# Patient Record
Sex: Male | Born: 1965
Health system: Southern US, Community
[De-identification: ages and names within clinical notes are randomized; demographics above are authoritative.]

## PROBLEM LIST (undated history)

## (undated) DIAGNOSIS — R011 Cardiac murmur, unspecified: Secondary | ICD-10-CM

## (undated) DIAGNOSIS — F17200 Nicotine dependence, unspecified, uncomplicated: Secondary | ICD-10-CM

## (undated) DIAGNOSIS — T7840XA Allergy, unspecified, initial encounter: Secondary | ICD-10-CM

## (undated) DIAGNOSIS — Z8679 Personal history of other diseases of the circulatory system: Secondary | ICD-10-CM

## (undated) DIAGNOSIS — I10 Essential (primary) hypertension: Secondary | ICD-10-CM

## (undated) DIAGNOSIS — F909 Attention-deficit hyperactivity disorder, unspecified type: Secondary | ICD-10-CM

## (undated) HISTORY — DX: Essential (primary) hypertension: I10

## (undated) HISTORY — PX: COLONOSCOPY: SHX174

## (undated) HISTORY — DX: Cardiac murmur, unspecified: R01.1

## (undated) HISTORY — DX: Nicotine dependence, unspecified, uncomplicated: F17.200

## (undated) HISTORY — DX: Allergy, unspecified, initial encounter: T78.40XA

## (undated) HISTORY — PX: TONSILLECTOMY: SUR1361

## (undated) HISTORY — DX: Personal history of other diseases of the circulatory system: Z86.79

## (undated) HISTORY — PX: KNEE ARTHROSCOPY: SUR90

---

## 1997-08-03 ENCOUNTER — Emergency Department (HOSPITAL_COMMUNITY): Admission: EM | Admit: 1997-08-03 | Discharge: 1997-08-04 | Payer: Self-pay | Admitting: Internal Medicine

## 2000-01-05 ENCOUNTER — Ambulatory Visit (HOSPITAL_BASED_OUTPATIENT_CLINIC_OR_DEPARTMENT_OTHER): Admission: RE | Admit: 2000-01-05 | Discharge: 2000-01-05 | Payer: Self-pay | Admitting: Orthopaedic Surgery

## 2005-05-31 ENCOUNTER — Emergency Department (HOSPITAL_COMMUNITY): Admission: EM | Admit: 2005-05-31 | Discharge: 2005-06-01 | Payer: Self-pay | Admitting: Emergency Medicine

## 2005-06-07 ENCOUNTER — Ambulatory Visit: Payer: Self-pay | Admitting: Family Medicine

## 2005-12-01 ENCOUNTER — Ambulatory Visit: Payer: Self-pay | Admitting: Family Medicine

## 2005-12-07 ENCOUNTER — Ambulatory Visit: Payer: Self-pay | Admitting: Family Medicine

## 2006-01-02 HISTORY — PX: VASECTOMY: SHX75

## 2006-08-29 ENCOUNTER — Emergency Department (HOSPITAL_COMMUNITY): Admission: EM | Admit: 2006-08-29 | Discharge: 2006-08-30 | Payer: Self-pay | Admitting: Emergency Medicine

## 2007-01-08 ENCOUNTER — Ambulatory Visit: Payer: Self-pay | Admitting: Family Medicine

## 2007-02-22 ENCOUNTER — Ambulatory Visit: Payer: Self-pay | Admitting: Family Medicine

## 2007-03-17 ENCOUNTER — Emergency Department (HOSPITAL_COMMUNITY): Admission: EM | Admit: 2007-03-17 | Discharge: 2007-03-17 | Payer: Self-pay | Admitting: Family Medicine

## 2007-10-05 ENCOUNTER — Emergency Department (HOSPITAL_COMMUNITY): Admission: EM | Admit: 2007-10-05 | Discharge: 2007-10-06 | Payer: Self-pay | Admitting: Emergency Medicine

## 2007-10-15 ENCOUNTER — Ambulatory Visit: Payer: Self-pay | Admitting: Family Medicine

## 2007-10-17 ENCOUNTER — Ambulatory Visit: Payer: Self-pay | Admitting: Family Medicine

## 2007-11-03 ENCOUNTER — Emergency Department (HOSPITAL_COMMUNITY): Admission: EM | Admit: 2007-11-03 | Discharge: 2007-11-03 | Payer: Self-pay | Admitting: Emergency Medicine

## 2008-01-21 ENCOUNTER — Emergency Department (HOSPITAL_COMMUNITY): Admission: EM | Admit: 2008-01-21 | Discharge: 2008-01-21 | Payer: Self-pay | Admitting: Family Medicine

## 2008-02-06 ENCOUNTER — Ambulatory Visit: Payer: Self-pay | Admitting: Family Medicine

## 2008-07-08 ENCOUNTER — Emergency Department (HOSPITAL_COMMUNITY): Admission: EM | Admit: 2008-07-08 | Discharge: 2008-07-08 | Payer: Self-pay | Admitting: Family Medicine

## 2008-09-10 ENCOUNTER — Ambulatory Visit: Payer: Self-pay | Admitting: Family Medicine

## 2009-01-04 ENCOUNTER — Ambulatory Visit: Payer: Self-pay | Admitting: Family Medicine

## 2009-09-13 ENCOUNTER — Ambulatory Visit: Payer: Self-pay | Admitting: Family Medicine

## 2010-01-11 ENCOUNTER — Ambulatory Visit
Admission: RE | Admit: 2010-01-11 | Discharge: 2010-01-11 | Payer: Self-pay | Source: Home / Self Care | Attending: Family Medicine | Admitting: Family Medicine

## 2010-02-17 ENCOUNTER — Ambulatory Visit: Payer: Self-pay | Admitting: Family Medicine

## 2010-05-04 ENCOUNTER — Institutional Professional Consult (permissible substitution) (INDEPENDENT_AMBULATORY_CARE_PROVIDER_SITE_OTHER): Payer: 59 | Admitting: Medical

## 2010-05-04 DIAGNOSIS — F988 Other specified behavioral and emotional disorders with onset usually occurring in childhood and adolescence: Secondary | ICD-10-CM

## 2010-05-20 NOTE — Op Note (Signed)
[place]. [facility]  Patient:    Joshua Henry, Joshua Henry                   MRN: [id number] Proc. Date: 01/05/00 Attending:  Claude Manges. Cleophas Dunker, M.D.                           Operative Report  PREOPERATIVE DIAGNOSIS:  Tear medial meniscus with locked knee, left knee.  POSTOPERATIVE DIAGNOSIS:  Plica.  OPERATION PERFORMED:  Diagnostic arthroscopy left knee with plickectomy.  SURGEON:  Claude Manges. Cleophas Dunker, M.D.  ASSISTANT:  Arnoldo Morale, P.A.  ANESTHESIA:  IV sedation and local 1% Xylocaine with epinephrine.  COMPLICATIONS:  None.  INDICATIONS FOR PROCEDURE:  The patient is a 45 year old male who injured his left knee while dancing at a Christmas party probably three weeks ago.  He was down on both of his knees in a crouched flexed position when he experienced the onset of left knee pain medially.  He didn ot appreciate any significant popping or clicking.  The pain is moderate and sharp.  It does wake him at night and he has been unable to fully extend his knee.  X-rays were negative, both medial and lateral joint spaces were symmetrical and there was no evidence of any loose material.  It is suspected that he has a tear of the medial meniscus, possibly even bucket handle in nature with a locked knee and is now to have arthroscopic evaluation.  DESCRIPTION OF PROCEDURE:  With the patient comfortable on the operating table under IV sedation, the left lower extremity was placed in thigh holder.  The leg was then prepped with DuraPrep from the thigh holder to the ankle. Sterile draping was performed.  Diagnostic arthroscopy was performed using a medial and lateral parapatellar tendon stab wound.  Irrigation was performed continuously with a cannula in the superior pouch medially.  There was negative effusion.  Diagnostic arthroscopy revealed no evidence of chondromalacia patella.  I did not see any loose material in the superior pouch or either gutter but  there was thickened tissue consistent with a plica in the medial gutter extending from the superior pouch to the medial meniscus.  The medial compartment was clear of meniscal pathology.  I carefully probed the meniscus throughout its entire length and could not find any evidence of a tear and surely there was no evidence of a bucket handle injury.  I did not see any chondromalacia of the femoral condyle or corresponding tibial plateau. Nor did I palpate or visualize any loose bodies.  The ACL was carefully evaluated.  The sheath was intact.  There was essentially negative anterior drawer sign.  The lateral compartment was also carefully inspected without evidence of meniscal pathology or chondromalacia or loose material.  The thickened medial compartment and medial gutter plica and tissue were resected with the intra-articular shaver.  At that point I could completely extend the knee.  I again carefully evaluated the entire joint without evidence of loose material.  The three stab wounds were left open, infiltrated with 0.25% Marcaine with epinephrine and sterile bulky dressing was applied followed by an Ace bandage and an ice pack.  The patient tolerated the procedure without complications.  PLAN:  Percocet for pain.  Office one week. DD:  01/05/00 TD:  01/05/00 Job: [id number]

## 2010-06-03 ENCOUNTER — Telehealth: Payer: Self-pay | Admitting: Family Medicine

## 2010-06-06 ENCOUNTER — Telehealth: Payer: Self-pay | Admitting: Family Medicine

## 2010-06-06 ENCOUNTER — Other Ambulatory Visit: Payer: Self-pay | Admitting: Medical

## 2010-06-06 MED ORDER — LISDEXAMFETAMINE DIMESYLATE 30 MG PO CAPS: 30.0000 mg | ORAL_CAPSULE | ORAL | Status: DC

## 2010-06-06 NOTE — Telephone Encounter (Signed)
Script ready.

## 2010-06-06 NOTE — Telephone Encounter (Signed)
Message copied by Dorthula Perfect on Mon Jun 06, 2010  4:57 PM ------      Message from: Aleen Campi, DAVID S      Created: Mon Jun 06, 2010  1:56 PM       I received msg about the Vyvanse not working.  At this point, if not working, we should increase the dose and f/u in 53mo.  We started him on a low dose, thus, I'd recommend going to the next dose of 30mg .  I'd rather do this first than switch to something else.  If agreeable, I'll print script, if not, can return to discuss other options.

## 2010-06-06 NOTE — Telephone Encounter (Signed)
pls pull chart 

## 2010-06-07 ENCOUNTER — Other Ambulatory Visit: Payer: Self-pay | Admitting: Family Medicine

## 2010-06-07 ENCOUNTER — Telehealth: Payer: Self-pay | Admitting: *Deleted

## 2010-06-07 MED ORDER — LISDEXAMFETAMINE DIMESYLATE 30 MG PO CAPS: 30.0000 mg | ORAL_CAPSULE | ORAL | Status: DC

## 2010-06-07 NOTE — Telephone Encounter (Signed)
Given to [place]

## 2010-06-07 NOTE — Telephone Encounter (Addendum)
Message copied by Dorthula Perfect on Tue Jun 07, 2010  9:08 AM ------      Message from: Aleen Campi, DAVID S      Created: Mon Jun 06, 2010  1:56 PM       I received msg about the Vyvanse not working.  At this point, if not working, we should increase the dose and f/u in 76mo.  We started him on a low dose, thus, I'd recommend going to the next dose of 30mg .  I'd rather do this first than switch to something else.  If agreeable, I'll print script, if not, can return to discuss other options.    Pt called and agreed to Vyvanse 30mg .  Would like 3 months supply.  Pt notified of prescription of Vyvanse 30 mg for a 3 month supply.  Prescription at front desk for pt to pickup and pt is aware.  CM,LPN

## 2010-07-07 ENCOUNTER — Ambulatory Visit (HOSPITAL_COMMUNITY)
Admission: RE | Admit: 2010-07-07 | Discharge: 2010-07-07 | Disposition: A | Discharge status: Home / Self Care | Payer: 59 | Source: Ambulatory Visit | Attending: Orthopaedic Surgery | Admitting: Orthopaedic Surgery

## 2010-07-07 ENCOUNTER — Encounter (HOSPITAL_COMMUNITY)
Admission: RE | Admit: 2010-07-07 | Discharge: 2010-07-07 | Disposition: A | Discharge status: Home / Self Care | Payer: 59 | Source: Ambulatory Visit | Attending: Orthopaedic Surgery | Admitting: Orthopaedic Surgery

## 2010-07-07 ENCOUNTER — Other Ambulatory Visit (HOSPITAL_COMMUNITY): Payer: Self-pay | Admitting: Orthopaedic Surgery

## 2010-07-07 DIAGNOSIS — Z01810 Encounter for preprocedural cardiovascular examination: Secondary | ICD-10-CM | POA: Insufficient documentation

## 2010-07-07 DIAGNOSIS — Z01811 Encounter for preprocedural respiratory examination: Secondary | ICD-10-CM

## 2010-07-07 DIAGNOSIS — Z01818 Encounter for other preprocedural examination: Secondary | ICD-10-CM | POA: Insufficient documentation

## 2010-07-07 DIAGNOSIS — Z01812 Encounter for preprocedural laboratory examination: Secondary | ICD-10-CM | POA: Insufficient documentation

## 2010-07-07 LAB — URINALYSIS, ROUTINE W REFLEX MICROSCOPIC
Hgb urine dipstick: NEGATIVE
Ketones, ur: NEGATIVE mg/dL
Nitrite: NEGATIVE
Protein, ur: NEGATIVE mg/dL
Specific Gravity, Urine: 1.025 (ref 1.005–1.030)
Urobilinogen, UA: 1 mg/dL (ref 0.0–1.0)
pH: 6.5 (ref 5.0–8.0)

## 2010-07-07 LAB — SURGICAL PCR SCREEN
MRSA, PCR: NEGATIVE
Staphylococcus aureus: NEGATIVE

## 2010-07-07 LAB — CBC
HCT: 46.5 % (ref 39.0–52.0)
MCHC: 36.8 g/dL — ABNORMAL HIGH (ref 30.0–36.0)
MCV: 90.8 fL (ref 78.0–100.0)
Platelets: 198 10*3/uL (ref 150–400)

## 2010-07-07 LAB — COMPREHENSIVE METABOLIC PANEL
ALT: 18 U/L (ref 0–53)
AST: 23 U/L (ref 0–37)
Albumin: 4 g/dL (ref 3.5–5.2)
Alkaline Phosphatase: 93 U/L (ref 39–117)
CO2: 31 mEq/L (ref 19–32)
GFR calc Af Amer: >60 mL/min (ref >60)
Potassium: 5.3 mEq/L — ABNORMAL HIGH (ref 3.5–5.1)
Total Bilirubin: 0.2 mg/dL — ABNORMAL LOW (ref 0.3–1.2)

## 2010-07-07 LAB — PROTIME-INR: Prothrombin Time: 11.8 seconds (ref 11.6–15.2)

## 2010-07-07 LAB — DIFFERENTIAL
Monocytes Absolute: 0.7 10*3/uL (ref 0.1–1.0)
Neutro Abs: 5 10*3/uL (ref 1.7–7.7)

## 2010-07-12 ENCOUNTER — Ambulatory Visit (HOSPITAL_COMMUNITY)
Admission: RE | Admit: 2010-07-12 | Discharge: 2010-07-12 | Disposition: A | Discharge status: Home / Self Care | Payer: 59 | Source: Ambulatory Visit | Attending: Orthopaedic Surgery | Admitting: Orthopaedic Surgery

## 2010-07-12 DIAGNOSIS — Z01810 Encounter for preprocedural cardiovascular examination: Secondary | ICD-10-CM | POA: Insufficient documentation

## 2010-07-12 DIAGNOSIS — M659 Unspecified synovitis and tenosynovitis, unspecified site: Secondary | ICD-10-CM | POA: Insufficient documentation

## 2010-07-12 DIAGNOSIS — IMO0002 Reserved for concepts with insufficient information to code with codable children: Secondary | ICD-10-CM | POA: Insufficient documentation

## 2010-07-12 DIAGNOSIS — X500XXA Overexertion from strenuous movement or load, initial encounter: Secondary | ICD-10-CM | POA: Insufficient documentation

## 2010-07-12 DIAGNOSIS — Z01812 Encounter for preprocedural laboratory examination: Secondary | ICD-10-CM | POA: Insufficient documentation

## 2010-07-12 DIAGNOSIS — F17200 Nicotine dependence, unspecified, uncomplicated: Secondary | ICD-10-CM | POA: Insufficient documentation

## 2010-07-13 NOTE — Op Note (Signed)
NAME:  Joshua Henry, Joshua Henry          ACCOUNT NO.:  [id number]  MEDICAL RECORD NO.:  [id number]  LOCATION:  SDS                          FACILITY:  [facility]  PHYSICIAN:  Claude Manges. Karman Biswell, M.D.DATE OF BIRTH:  12/21/1965  DATE OF PROCEDURE:  07/12/2010 DATE OF DISCHARGE:                              OPERATIVE REPORT   PREOPERATIVE DIAGNOSIS:  Displaced bucket-handle tear of medial meniscus, left knee.  POSTOPERATIVE DIAGNOSIS:  Displaced bucket-handle tear of medial meniscus, left knee.  PROCEDURE:  Diagnostic arthroscopy, left knee with partial medial meniscectomy.  SURGEON:  Claude Manges. Cleophas Dunker, MD  ASSISTANT:  Oris Drone. Petrarca, PA-C  ANESTHESIA:  General.  COMPLICATIONS:  None.  HISTORY:  A 45 year old gentleman was stooping about a week ago when he had immediate onset of pain in his left knee with inability to fully extend the knee.  He was seen in the office last week with what appeared to be a displaced bucket-handle tear of the medial meniscus.  Because of his locked knee and the fact that it has not resolved and he is still uncomfortable, he is now to have an arthroscopic evaluation.  PROCEDURE:  Joshua Henry was met in the holding area where I identified his left knee as appropriate operative site.  He was then transported to room #1 and placed under general anesthesia without difficulty.  The left lower extremity was placed in a thigh holder.  Leg was then prepped with DuraPrep from the thigh holder to the ankle.  Sterile draping was performed.  Diagnostic arthroscopy was performed using a medial and lateral parapatellar tendon puncture site.  The portals appeared to be a little low, so I just lengthen them slightly and then I could have better visualization.  Lateral compartment was intact as the meniscus.  There was no tearing in the lateral meniscus.  There were no loose bodies. The ACL and PCL appeared to be intact.  There appeared to be a little bit of  fraying of the ACL, but otherwise, appeared to be intact with negative anterior-drawer sign.  There was diffuse synovitis in the superior pouch and about a 30 mL effusion.  In the medial compartment, there was an obvious displaced bucket-handle tear of the medial meniscus involving at least 60%.  It was still attached anteriorly at the junction of the middle and anterior third and posteriorly at the horn. I established a third arthroscopic portal through the center of the patella tendon so that I could debride the meniscus, and using a combination of intra-articular scissors, the ArthroCare wand, and the shaver, I was able to debride the meniscus completely.  I then carefully probed it.  There was a small horizontal cleavage tear in the very posterior aspect of the meniscus which was debrided with the ArthroCare Wand.  At that point, the knee would fully extend and I carefully probed the meniscus and the remaining meniscus was intact.  Any further synovitis was debrided.  There was some chondromalacia representing grade 2 in the medial compartment, and I thought some area of exposed tibia plateau very anteriorly, but I did not see any loose bodies.  The wounds were then irrigated.  They were closed with 3-0 Vicryl and then 4-0 Ethibond.  A sterile bulky dressing was applied.  PLAN:  Vicodin for pain.  Office in 1 week.     Claude Manges. Cleophas Dunker, M.D.     PWW/MEDQ  D:  07/12/2010  T:  07/13/2010  Job:  [id number]  Electronically Signed by Norlene Campbell M.D. on 07/13/2010 11:42:35 AM

## 2010-09-12 ENCOUNTER — Ambulatory Visit (INDEPENDENT_AMBULATORY_CARE_PROVIDER_SITE_OTHER): Payer: 59 | Admitting: Family Medicine

## 2010-09-12 ENCOUNTER — Encounter: Payer: Self-pay | Admitting: Family Medicine

## 2010-09-12 VITALS — BP 130/86 | HR 67 | Wt 173.0 lb

## 2010-09-12 DIAGNOSIS — F909 Attention-deficit hyperactivity disorder, unspecified type: Secondary | ICD-10-CM

## 2010-09-12 DIAGNOSIS — F1010 Alcohol abuse, uncomplicated: Secondary | ICD-10-CM

## 2010-09-12 MED ORDER — LISDEXAMFETAMINE DIMESYLATE 20 MG PO CAPS: 20.0000 mg | ORAL_CAPSULE | ORAL | Status: DC

## 2010-09-12 NOTE — Patient Instructions (Signed)
Have her Resa call me after she has met with you and we will discuss medication at that point

## 2010-09-12 NOTE — Progress Notes (Signed)
  Subjective:    Patient ID: Joshua Henry, male    DOB: 1965/09/15, 45 y.o.   MRN: [id number]  HPI He is here for recheck. Since switching to Vyvanse he is getting less of a hyper sensation and states that he is sleeping better. It lasts roughly the same time however he is not having any withdrawal symptoms. He is also involved in counseling but mainly at request of his wife. He has had vocal related problems and in the past has been involved in [place]. He is no longer interested in [place]. He does not think he has a drinking problem. He also seems to have very little interest in counseling except at the request of his wife. His wife thinks he might have a mood disorder. Review of Systems     Objective:   Physical Exam Alert and in no distress otherwise not examined      Assessment & Plan:  ADD. Alcohol abuse. Encouraged him to get involved in counseling and have his therapist call me for her input into possible medication use. I will also renew his Vyvanse or September, October and November.

## 2010-09-19 ENCOUNTER — Other Ambulatory Visit: Payer: Self-pay | Admitting: Family Medicine

## 2010-09-19 MED ORDER — LISDEXAMFETAMINE DIMESYLATE 30 MG PO CAPS: 30.0000 mg | ORAL_CAPSULE | ORAL | Status: DC

## 2010-09-19 NOTE — Telephone Encounter (Signed)
All of our records indicate he is getting 20 mg. Inform him of this and if there is any questions check with the pharmacy

## 2010-09-19 NOTE — Telephone Encounter (Signed)
In may pt was on 20 mg after that he has been on 30 mg until now

## 2010-09-19 NOTE — Telephone Encounter (Signed)
Addended by: Ronnald Nian on: 09/19/2010 04:25 PM   Modules accepted: Orders

## 2010-09-19 NOTE — Telephone Encounter (Signed)
Called pt to inform him he was right to bring in old rx and will write new ones for 30mg 

## 2010-09-19 NOTE — Telephone Encounter (Signed)
He is indeed getting by Faylene Million 30 mg. He was not documented in the right part of the chart. I will write new prescriptions for September October and November and he will turn the old ones in for destruction.

## 2010-10-08 ENCOUNTER — Inpatient Hospital Stay (INDEPENDENT_AMBULATORY_CARE_PROVIDER_SITE_OTHER)
Admission: RE | Admit: 2010-10-08 | Discharge: 2010-10-08 | Disposition: A | Discharge status: Home / Self Care | Payer: 59 | Source: Ambulatory Visit | Attending: Family Medicine | Admitting: Family Medicine

## 2010-10-08 DIAGNOSIS — B029 Zoster without complications: Secondary | ICD-10-CM

## 2010-10-08 DIAGNOSIS — N509 Disorder of male genital organs, unspecified: Secondary | ICD-10-CM

## 2010-10-08 DIAGNOSIS — M549 Dorsalgia, unspecified: Secondary | ICD-10-CM

## 2010-10-08 LAB — POCT URINALYSIS DIP (DEVICE)
Bilirubin Urine: NEGATIVE
Glucose, UA: NEGATIVE mg/dL
Leukocytes, UA: NEGATIVE
Specific Gravity, Urine: 1.02 (ref 1.005–1.030)
Urobilinogen, UA: 1 mg/dL (ref 0.0–1.0)

## 2010-10-10 ENCOUNTER — Other Ambulatory Visit: Payer: Self-pay | Admitting: Family Medicine

## 2010-10-10 LAB — GC/CHLAMYDIA PROBE AMP, GENITAL: GC Probe Amp, Genital: NEGATIVE

## 2010-10-10 MED ORDER — HYDROCODONE-ACETAMINOPHEN 7.5-750 MG PO TABS: 1.0000 | ORAL_TABLET | Freq: Four times a day (QID) | ORAL | Status: DC | PRN

## 2010-10-10 NOTE — Telephone Encounter (Signed)
Called pt to let him know med was called and he needs appt

## 2010-10-10 NOTE — Telephone Encounter (Signed)
Call in Vicodin ES  #20 and explained that this is a higher strength pill. Have him set up an appointment to see me in the next day or so

## 2010-10-12 ENCOUNTER — Encounter: Payer: Self-pay | Admitting: Family Medicine

## 2010-10-12 ENCOUNTER — Ambulatory Visit (INDEPENDENT_AMBULATORY_CARE_PROVIDER_SITE_OTHER): Payer: 59 | Admitting: Family Medicine

## 2010-10-12 VITALS — BP 112/70 | HR 73 | Wt 170.0 lb

## 2010-10-12 DIAGNOSIS — H6120 Impacted cerumen, unspecified ear: Secondary | ICD-10-CM

## 2010-10-12 DIAGNOSIS — H9319 Tinnitus, unspecified ear: Secondary | ICD-10-CM

## 2010-10-12 DIAGNOSIS — K5900 Constipation, unspecified: Secondary | ICD-10-CM

## 2010-10-12 DIAGNOSIS — B029 Zoster without complications: Secondary | ICD-10-CM

## 2010-10-12 MED ORDER — HYDROCODONE-ACETAMINOPHEN 10-660 MG PO TABS: 1.0000 | ORAL_TABLET | Freq: Four times a day (QID) | ORAL | Status: DC | PRN

## 2010-10-12 NOTE — Progress Notes (Signed)
  Subjective:    Patient ID: Joshua Henry, male    DOB: [birth year], 45 y.o.   MRN: [id number]  HPI He is here for followup on recent diagnosis of shingles. He is still having a significant amount of pain it radiates down his left leg. He also has had some difficulty with constipation related to this and his pain meds. The pain meds are not holding him long enough. He also complains of a ringing in sensation in his right ear that has been intermittent over the last several months. No earache, sore throat, fever or chills   Review of Systems     Objective:   Physical Exam alert and in no distress. Tympanic membranes both show some scarring especially on the right, canals are normal. Throat is clear. Tonsils are normal. Neck is supple without adenopathy or thyromegaly. Cardiac exam shows a regular sinus rhythm without murmurs or gallops. Lungs are clear to auscultation. Slightly pigmented area is noted in the L2 on the left near the mid spine area. No other skin lesions are noted.        Assessment & Plan:   1. Shingles outbreak   2. Cerumen impaction   3. Tinnitus   4. Constipation    the tinnitus to go away after the cerumen was removed. We will not pursue the ear issue unless it continues. I will also give him Vicodin 10/660. Discussed pain management with him now and long term. Followup here as needed.

## 2010-10-12 NOTE — Patient Instructions (Addendum)
Use pain medicine as needed every 4 hours. Stop the doxycycline Use a laxative of choice to help with the constipation. He can also use Metamucil to help keep more regular

## 2010-10-14 LAB — SAMPLE TO BLOOD BANK

## 2010-10-14 LAB — I-STAT 8, (EC8 V) (CONVERTED LAB)
Bicarbonate: 25.4 — ABNORMAL HIGH
Chloride: 104
HCT: 47
Hemoglobin: 16
Operator id: 277751
TCO2: 27
pCO2, Ven: 44.6 — ABNORMAL LOW
pH, Ven: 7.363 — ABNORMAL HIGH

## 2010-10-14 LAB — URINALYSIS, ROUTINE W REFLEX MICROSCOPIC
Bilirubin Urine: NEGATIVE
Glucose, UA: NEGATIVE
Hgb urine dipstick: NEGATIVE
Ketones, ur: NEGATIVE
Nitrite: NEGATIVE
Specific Gravity, Urine: 1.011
pH: 6.5

## 2010-10-14 LAB — POCT I-STAT CREATININE: Creatinine, Ser: 0.9

## 2010-12-14 ENCOUNTER — Telehealth: Payer: Self-pay | Admitting: Internal Medicine

## 2010-12-14 ENCOUNTER — Telehealth: Payer: Self-pay | Admitting: Family Medicine

## 2010-12-14 MED ORDER — LISDEXAMFETAMINE DIMESYLATE 20 MG PO CAPS: 20.0000 mg | ORAL_CAPSULE | ORAL | Status: DC

## 2010-12-14 NOTE — Telephone Encounter (Signed)
TSD  

## 2010-12-14 NOTE — Telephone Encounter (Signed)
Vyvanse prescription written

## 2011-03-13 ENCOUNTER — Encounter: Payer: Self-pay | Admitting: Family Medicine

## 2011-03-13 ENCOUNTER — Ambulatory Visit (INDEPENDENT_AMBULATORY_CARE_PROVIDER_SITE_OTHER): Payer: 59 | Admitting: Family Medicine

## 2011-03-13 ENCOUNTER — Encounter: Payer: 59 | Admitting: Family Medicine

## 2011-03-13 DIAGNOSIS — H6120 Impacted cerumen, unspecified ear: Secondary | ICD-10-CM

## 2011-03-13 DIAGNOSIS — H918X9 Other specified hearing loss, unspecified ear: Secondary | ICD-10-CM

## 2011-03-13 DIAGNOSIS — F988 Other specified behavioral and emotional disorders with onset usually occurring in childhood and adolescence: Secondary | ICD-10-CM | POA: Insufficient documentation

## 2011-03-13 MED ORDER — LISDEXAMFETAMINE DIMESYLATE 30 MG PO CAPS: 30.0000 mg | ORAL_CAPSULE | ORAL | Status: DC

## 2011-03-13 NOTE — Progress Notes (Signed)
  Subjective:    Patient ID: Joshua Henry, male    DOB: May 09, 1965, 46 y.o.   MRN: [id number]  HPI He is here for medication refill. He is now taking 5 bands 30 mg. There was apparently a mixup with communication of the correct dosing and 30 mg does work well. He would like a 90 day supply of this. He continues to go school but has become disenchanted with his present course. He also been having difficulty with hearing from the right ear. He has a previous history of cerumen impaction.   Review of Systems     Objective:   Physical Exam Cerumen was removed from the right canal. Both TMs do show some scarring.       Assessment & Plan:   1. ADD (attention deficit disorder)   2. Hearing loss secondary to cerumen impaction    Vyvance written for 90 days.

## 2011-03-14 ENCOUNTER — Telehealth: Payer: Self-pay | Admitting: Family Medicine

## 2011-03-20 ENCOUNTER — Telehealth: Payer: Self-pay | Admitting: Family Medicine

## 2011-03-20 NOTE — Telephone Encounter (Signed)
P.A. Arlyce Harman APPROVED TIL 03/16/2037

## 2011-03-20 NOTE — Telephone Encounter (Signed)
DONE

## 2011-06-13 ENCOUNTER — Institutional Professional Consult (permissible substitution): Payer: 59 | Admitting: Family Medicine

## 2011-06-16 ENCOUNTER — Telehealth: Payer: Self-pay | Admitting: Family Medicine

## 2011-06-16 MED ORDER — LISDEXAMFETAMINE DIMESYLATE 30 MG PO CAPS: 30.0000 mg | ORAL_CAPSULE | ORAL | Status: DC

## 2011-06-16 NOTE — Telephone Encounter (Signed)
Vyvanse renewed 

## 2011-06-19 ENCOUNTER — Encounter: Payer: 59 | Admitting: Family Medicine

## 2011-09-18 ENCOUNTER — Other Ambulatory Visit: Payer: Self-pay | Admitting: Internal Medicine

## 2011-09-18 MED ORDER — LISDEXAMFETAMINE DIMESYLATE 30 MG PO CAPS: 30.0000 mg | ORAL_CAPSULE | ORAL | Status: DC

## 2011-09-18 NOTE — Telephone Encounter (Signed)
Called pt cell # to inform him he should have rx covering him the month of sept left message on phone

## 2011-09-18 NOTE — Telephone Encounter (Signed)
PT CALLED ME BACK SAID HE ONLY GOT A 90 DAY SUPPLY IN June SO THAT WOULD LAST Tammi Sou

## 2011-09-18 NOTE — Telephone Encounter (Signed)
Let him know that he should have a prescription covering him for September

## 2011-09-18 NOTE — Telephone Encounter (Signed)
90 day Vyvanse prescription written

## 2011-10-23 ENCOUNTER — Encounter: Payer: 59 | Admitting: Family Medicine

## 2011-11-07 ENCOUNTER — Encounter: Payer: 59 | Admitting: Family Medicine

## 2011-11-24 ENCOUNTER — Encounter: Payer: Self-pay | Admitting: Medical

## 2011-11-24 ENCOUNTER — Ambulatory Visit (INDEPENDENT_AMBULATORY_CARE_PROVIDER_SITE_OTHER): Payer: 59 | Admitting: Medical

## 2011-11-24 ENCOUNTER — Ambulatory Visit
Admission: RE | Admit: 2011-11-24 | Discharge: 2011-11-24 | Disposition: A | Discharge status: Home / Self Care | Payer: 59 | Source: Ambulatory Visit | Attending: Medical | Admitting: Medical

## 2011-11-24 VITALS — BP 120/80 | HR 82 | Temp 98.1°F | Wt 186.0 lb

## 2011-11-24 DIAGNOSIS — R059 Cough, unspecified: Secondary | ICD-10-CM

## 2011-11-24 DIAGNOSIS — R062 Wheezing: Secondary | ICD-10-CM

## 2011-11-24 DIAGNOSIS — R05 Cough: Secondary | ICD-10-CM

## 2011-11-24 DIAGNOSIS — M549 Dorsalgia, unspecified: Secondary | ICD-10-CM

## 2011-11-24 DIAGNOSIS — IMO0002 Reserved for concepts with insufficient information to code with codable children: Secondary | ICD-10-CM

## 2011-11-24 DIAGNOSIS — M6740 Ganglion, unspecified site: Secondary | ICD-10-CM

## 2011-11-24 LAB — CBC WITH DIFFERENTIAL/PLATELET
Basophils Relative: 0 % (ref 0–1)
Eosinophils Absolute: 0.1 10*3/uL (ref 0.0–0.7)
HCT: 48.2 % (ref 39.0–52.0)
Hemoglobin: 17.2 g/dL — ABNORMAL HIGH (ref 13.0–17.0)
Lymphs Abs: 2.6 10*3/uL (ref 0.7–4.0)
MCH: 32.5 pg (ref 26.0–34.0)
MCHC: 35.7 g/dL (ref 30.0–36.0)
MCV: 90.9 fL (ref 78.0–100.0)
Monocytes Absolute: 0.6 10*3/uL (ref 0.1–1.0)
Monocytes Relative: 5 % (ref 3–12)
Neutrophils Relative %: 70 % (ref 43–77)
RBC: 5.3 MIL/uL (ref 4.22–5.81)

## 2011-11-24 MED ORDER — VALACYCLOVIR HCL 1 G PO TABS: 1000.0000 mg | ORAL_TABLET | Freq: Three times a day (TID) | ORAL | Status: DC

## 2011-11-24 NOTE — Progress Notes (Signed)
Subjective: Here for multiple concerns.  Wonders if he has shingles.  He reports hx/o mild shingles prior on the right back, and this past week has been having some discomfort similar to the way it was with prior shingles.  No rash, but odd sensitivity.  He notes intermittent cough and wheezing for the last few months.  He is a long term smoker, a little over 1 ppd.  Wants CXR.  At times feels like saliva goes down wrong way.  No hemoptysis, no weight loss, night sweats, chills.  No allergy symptoms, doesn't feel sick.  No GERD symptoms.    Has cyst on hand he is concerns about.  Sometimes painful.  Wants it cut out - right palm  Past Medical History  Diagnosis Date  . Allergy     RHINITIS  . Smoker   . Heart murmur     TRIVIAL TRICUSPPID INSUFF.  . H/O: rheumatic fever    ROS as in HPI    Objective:   Physical Exam  Filed Vitals:   11/24/11 1339  BP: 120/80  Pulse: 82  Temp: 98.1 F (36.7 C)    General appearance: alert, no distress, WD/WN HEENT: normocephalic, sclerae anicteric, TMs pearly, nares patent, no discharge or erythema, pharynx normal Oral cavity: MMM, no lesions Neck: supple, no lymphadenopathy, no thyromegaly, no masses Heart: RRR, normal S1, S2, no murmurs Lungs: +rhonchi lower fields, dullness to percussion bilat lower fields, otherwise clear, no wheezes or rales Skin: no obvious rash.  Right palm with 1cm thickened yellowish dense tissue, round lesion, suggestive of cystic lesion, but nonmobile Back: nontender  Assessment and Plan :    Encounter Diagnoses  Name Primary?  . Wheezing Yes  . Cough   . Cyst of hand   . Back pain    Wheezing, cough - CXR, labs today.  Advised he stop smoking.  Cyst of hand - will discuss with Dr. Susann Givens, but will likely refer to hand surgeon  Back pain - possible shingles.  Script or Valtrex in the event things fulminate. Call/return prn.  discussed precautions.  Follow-up pending studies

## 2011-11-25 LAB — BASIC METABOLIC PANEL
BUN: 9 mg/dL (ref 6–23)
CO2: 26 mEq/L (ref 19–32)
Calcium: 9.2 mg/dL (ref 8.4–10.5)
Glucose, Bld: 100 mg/dL — ABNORMAL HIGH (ref 70–99)
Potassium: 4.4 mEq/L (ref 3.5–5.3)
Sodium: 138 mEq/L (ref 135–145)

## 2011-11-27 ENCOUNTER — Telehealth: Payer: Self-pay | Admitting: Medical

## 2011-11-28 NOTE — Telephone Encounter (Signed)
[facility]ANE [facility] CHANDRA HANDLED

## 2011-12-05 ENCOUNTER — Telehealth: Payer: Self-pay | Admitting: Family Medicine

## 2011-12-05 NOTE — Telephone Encounter (Signed)
PATIENT IS AWARE OF HIS APPOINTMENT WITH DR. GARY KUZMA ON 12/08/11 @ 1030 AM. [phone] CLS I Fax over office notes and insurance card through [employer]. CLS

## 2011-12-18 ENCOUNTER — Other Ambulatory Visit: Payer: Self-pay | Admitting: Family Medicine

## 2011-12-18 MED ORDER — LISDEXAMFETAMINE DIMESYLATE 30 MG PO CAPS: 30.0000 mg | ORAL_CAPSULE | ORAL | Status: DC

## 2011-12-18 NOTE — Telephone Encounter (Signed)
Pt called and wants refill for Vyvanse 30 mg qd.  If you can get it ready today please call him at [phone]

## 2011-12-18 NOTE — Telephone Encounter (Signed)
Vyvanse renewed 

## 2012-03-22 ENCOUNTER — Telehealth: Payer: Self-pay | Admitting: Family Medicine

## 2012-03-25 ENCOUNTER — Institutional Professional Consult (permissible substitution): Payer: Self-pay | Admitting: Family Medicine

## 2012-03-25 NOTE — Telephone Encounter (Signed)
30 day supply of Vyvanse given

## 2012-05-02 ENCOUNTER — Encounter: Payer: Self-pay | Admitting: Family Medicine

## 2012-05-02 ENCOUNTER — Ambulatory Visit (INDEPENDENT_AMBULATORY_CARE_PROVIDER_SITE_OTHER): Payer: 59 | Admitting: Family Medicine

## 2012-05-02 VITALS — BP 130/90 | HR 86 | Wt 197.0 lb

## 2012-05-02 DIAGNOSIS — M79609 Pain in unspecified limb: Secondary | ICD-10-CM

## 2012-05-02 DIAGNOSIS — F988 Other specified behavioral and emotional disorders with onset usually occurring in childhood and adolescence: Secondary | ICD-10-CM

## 2012-05-02 DIAGNOSIS — M79671 Pain in right foot: Secondary | ICD-10-CM

## 2012-05-02 MED ORDER — METHYLPHENIDATE HCL ER (LA) 10 MG PO CP24: 10.0000 mg | ORAL_CAPSULE | ORAL | Status: DC

## 2012-05-02 NOTE — Patient Instructions (Addendum)
Call me and let me know how the different strengths of rhythm is working. Keep your feet clean and dry.

## 2012-05-02 NOTE — Progress Notes (Signed)
  Subjective:    Patient ID: Joshua Henry, male    DOB: Nov 23, 1965, 47 y.o.   MRN: [id number]  HPI He is here for consult concerning his ADD. He would like to be switched to a less expensive medication. He's paying $75 every 3 months and states that this is too high. In the past he had been on Ritalin and did have some side effects from medication. He also complains of intermittent difficulty with right lateral foot pain mainly in the fourth and fifth toe area. No history of trauma.   Review of Systems     Objective:   Physical Exam Alert and in no distress. Exam of the right foot does show maceration between the fourth and fifth toes. There is no tenderness palpation, erythema or warmth.       Assessment & Plan:  ADD (attention deficit disorder) - Plan: methylphenidate (RITALIN LA) 10 MG 24 hr capsule  Right foot pain does use of Ritalin LA to see if this will help diminish side effects. Also recommend general care for his foot including washing and when he can going barefoot to help with keeping the area dry.

## 2012-05-04 ENCOUNTER — Telehealth: Payer: Self-pay | Admitting: Family Medicine

## 2012-05-09 NOTE — Telephone Encounter (Signed)
LM

## 2012-06-28 ENCOUNTER — Encounter: Payer: 59 | Admitting: Family Medicine

## 2012-07-10 ENCOUNTER — Telehealth: Payer: Self-pay | Admitting: Family Medicine

## 2012-07-10 NOTE — Telephone Encounter (Signed)
PT CALLED AND STATED HE WAS RECENTLY CHANGED FROM VYVANCE TO RITALIN XR DUE TO COST. PT STATES IT IS STILL TOO EXPENSIVE. HE IS REQUESTING THAT HE CHANGE TO ADDERALL TO TRY THAT. PLEASE CALL PT AT [phone].

## 2012-07-11 ENCOUNTER — Telehealth: Payer: Self-pay | Admitting: Family Medicine

## 2012-07-11 MED ORDER — AMPHETAMINE-DEXTROAMPHETAMINE 10 MG PO TABS: 10.0000 mg | ORAL_TABLET | Freq: Two times a day (BID) | ORAL | Status: DC

## 2012-07-11 NOTE — Telephone Encounter (Signed)
FAXED IN

## 2012-07-11 NOTE — Telephone Encounter (Signed)
Tell him to come by and pick up the prescription and let him know that he needs to let me know if it works, how long and if he has any troubles with that.

## 2012-07-11 NOTE — Telephone Encounter (Signed)
He states that his ADD meds 2 expensive and he would like to try Adderall. I will start him on 10 mg. He is to let me know if it works, how long and if he has any trouble.

## 2012-07-11 NOTE — Telephone Encounter (Signed)
Received prior auth notice for adderall. Form was requested and sending back to cheri for completion.

## 2012-07-11 NOTE — Telephone Encounter (Signed)
Pt notified that note is ready and to call him if he has any questions about the medicine

## 2012-07-11 NOTE — Telephone Encounter (Signed)
I THINK THIS COME TO YOU

## 2012-07-16 ENCOUNTER — Telehealth: Payer: Self-pay | Admitting: Family Medicine

## 2012-07-16 NOTE — Telephone Encounter (Signed)
LM

## 2012-08-05 ENCOUNTER — Encounter: Payer: 59 | Admitting: Family Medicine

## 2012-11-26 ENCOUNTER — Ambulatory Visit (INDEPENDENT_AMBULATORY_CARE_PROVIDER_SITE_OTHER): Payer: 59 | Admitting: Family Medicine

## 2012-11-26 ENCOUNTER — Encounter: Payer: Self-pay | Admitting: Family Medicine

## 2012-11-26 VITALS — BP 116/80 | HR 66 | Ht 70.0 in | Wt 201.0 lb

## 2012-11-26 DIAGNOSIS — J301 Allergic rhinitis due to pollen: Secondary | ICD-10-CM

## 2012-11-26 DIAGNOSIS — F988 Other specified behavioral and emotional disorders with onset usually occurring in childhood and adolescence: Secondary | ICD-10-CM

## 2012-11-26 DIAGNOSIS — F17200 Nicotine dependence, unspecified, uncomplicated: Secondary | ICD-10-CM

## 2012-11-26 DIAGNOSIS — Z0000 Encounter for general adult medical examination without abnormal findings: Secondary | ICD-10-CM

## 2012-11-26 LAB — CBC WITH DIFFERENTIAL/PLATELET
Basophils Absolute: 0.1 10*3/uL (ref 0.0–0.1)
Basophils Relative: 1 % (ref 0–1)
Eosinophils Relative: 1 % (ref 0–5)
HCT: 49.5 % (ref 39.0–52.0)
MCHC: 35.8 g/dL (ref 30.0–36.0)
MCV: 92 fL (ref 78.0–100.0)
Monocytes Absolute: 0.5 10*3/uL (ref 0.1–1.0)
Platelets: 154 10*3/uL (ref 150–400)
RDW: 13.8 % (ref 11.5–15.5)

## 2012-11-26 LAB — LIPID PANEL
HDL: 47 mg/dL (ref >39)
LDL Cholesterol: 124 mg/dL — ABNORMAL HIGH (ref 0–99)
Total CHOL/HDL Ratio: 5.1 Ratio
VLDL: 69 mg/dL — ABNORMAL HIGH (ref 0–40)

## 2012-11-26 LAB — HEMOCCULT GUIAC POC 1CARD (OFFICE)

## 2012-11-26 LAB — COMPREHENSIVE METABOLIC PANEL
AST: 25 U/L (ref 0–37)
Alkaline Phosphatase: 90 U/L (ref 39–117)
BUN: 11 mg/dL (ref 6–23)
Creat: 1.01 mg/dL (ref 0.50–1.35)
Total Bilirubin: 0.4 mg/dL (ref 0.3–1.2)

## 2012-11-26 MED ORDER — AMPHETAMINE-DEXTROAMPHETAMINE 10 MG PO TABS: 10.0000 mg | ORAL_TABLET | Freq: Two times a day (BID) | ORAL | Status: DC

## 2012-11-26 NOTE — Progress Notes (Signed)
Subjective:    Patient ID: Joshua Henry, male    DOB: 1965-02-15, 47 y.o.   MRN: [id number]  HPI He is here for a complete examination. He recently quit his job is now looking for a new one. Apparently he had a falling out with his boss. He does have underlying ADD and like a refill on his Adderall. Apparently the 10 mg twice a day has worked well for him. His allergies are under good control. He continues to smoke and at this point is not ready to quit. He has gained a lot of weight recently due to his excessive eating and drinking. He does plan to make some changes in that regard. He has no other concerns or complaints.   Review of Systems  Constitutional: Negative.   HENT: Negative.   Eyes: Negative.   Respiratory: Negative.   Cardiovascular: Negative.   Gastrointestinal: Negative.   Endocrine: Negative.   Genitourinary: Negative.   Allergic/Immunologic: Negative.   Neurological: Negative.   Hematological: Negative.   Psychiatric/Behavioral: Negative.        Objective:   Physical Exam BP 116/80  Pulse 66  Ht 5\' 10"  (1.778 m)  Wt 201 lb (91.173 kg)  BMI 28.84 kg/m2  General Appearance:    Alert, cooperative, no distress, appears stated age  Head:    Normocephalic, without obvious abnormality, atraumatic  Eyes:    PERRL, conjunctiva/corneas clear, EOM's intact, fundi    benign  Ears:    Normal TM's and external ear canals  Nose:   Nares normal, mucosa normal, no drainage or sinus   tenderness  Throat:   Lips, mucosa, and tongue normal; teeth and gums normal  Neck:   Supple, no lymphadenopathy;  thyroid:  no   enlargement/tenderness/nodules; no carotid   bruit or JVD  Back:    Spine nontender, no curvature, ROM normal, no CVA     tenderness  Lungs:     Clear to auscultation bilaterally without wheezes, rales or     ronchi; respirations unlabored  Chest Wall:    No tenderness or deformity   Heart:    Regular rate and rhythm, S1 and S2 normal, no murmur, rub   or  gallop  Breast Exam:    No chest wall tenderness, masses or gynecomastia  Abdomen:     Soft, non-tender, nondistended, normoactive bowel sounds,    no masses, no hepatosplenomegaly     Rectal:    Normal sphincter tone, no masses or tenderness; guaiac negative stool.  Prostate smooth, no nodules, not enlarged.  Extremities:   No clubbing, cyanosis or edema  Pulses:   2+ and symmetric all extremities  Skin:   Skin color, texture, turgor normal, no rashes or lesions  Lymph nodes:   Cervical, supraclavicular, and axillary nodes normal  Neurologic:   CNII-XII intact, normal strength, sensation and gait; reflexes 2+ and symmetric throughout          Psych:   Normal mood, affect, hygiene and grooming.          Assessment & Plan:  Routine general medical examination at a health care facility - Plan: CBC with Differential, Comprehensive metabolic panel, Lipid panel, Hemoccult - 1 Card (office)  ADD (attention deficit disorder) - Plan: amphetamine-dextroamphetamine (ADDERALL) 10 MG tablet, amphetamine-dextroamphetamine (ADDERALL) 10 MG tablet, amphetamine-dextroamphetamine (ADDERALL) 10 MG tablet  Allergic rhinitis due to pollen  Current smoker  I discussed smoking with him as well as his weight. Encouraged him to make diet and exercise changes.  At this time he is not ready to quit smoking.

## 2012-11-27 NOTE — Progress Notes (Signed)
Quick Note:  MAILED PT LETTER OF LABS AND DIET INFO ______

## 2012-11-27 NOTE — Progress Notes (Signed)
Quick Note:  The lipids are too high. Sent dietary information. ______

## 2013-02-17 ENCOUNTER — Telehealth: Payer: Self-pay | Admitting: Family Medicine

## 2013-02-17 DIAGNOSIS — F988 Other specified behavioral and emotional disorders with onset usually occurring in childhood and adolescence: Secondary | ICD-10-CM

## 2013-02-17 MED ORDER — AMPHETAMINE-DEXTROAMPHETAMINE 10 MG PO TABS: 10.0000 mg | ORAL_TABLET | Freq: Two times a day (BID) | ORAL | Status: DC

## 2013-02-17 NOTE — Telephone Encounter (Signed)
Pt notified the rx are ready for pick up. Pt states he will call and schedule an appt for hand surgeron. Pt also wants you to call him as he has a lot of pain from the elbow to the forearm and he wants to talk to you about it.

## 2013-02-17 NOTE — Telephone Encounter (Signed)
Pt called and stated he needs refills/call when ready. Pt states he has cysts on the palm of his hands and has seen the hand specialist before. Is this something you can handle or does he need to see the specialists? He saw Audelia Acton for them before and Audelia Acton didn't want to mess with and pt was referred to hand specialist. Please inform pt when refills are ready and how to proceed with hand issues. Pt can be reached at [phone].

## 2013-02-17 NOTE — Telephone Encounter (Signed)
Let him know the prescription is ready at the front desk and refer him to a hand surgeon

## 2013-05-29 ENCOUNTER — Telehealth: Payer: Self-pay

## 2013-05-29 DIAGNOSIS — F988 Other specified behavioral and emotional disorders with onset usually occurring in childhood and adolescence: Secondary | ICD-10-CM

## 2013-05-29 NOTE — Telephone Encounter (Signed)
Pt states he is needing refills on his medications Ritalin and Adderall.Marland Kitchenare these ok to refill?

## 2013-05-30 MED ORDER — AMPHETAMINE-DEXTROAMPHETAMINE 10 MG PO TABS: 10.0000 mg | ORAL_TABLET | Freq: Two times a day (BID) | ORAL | Status: DC

## 2013-06-02 ENCOUNTER — Telehealth: Payer: Self-pay | Admitting: Family Medicine

## 2013-06-02 NOTE — Telephone Encounter (Signed)
Spoke with pt and he was informed rx is ready.

## 2013-09-02 ENCOUNTER — Telehealth: Payer: Self-pay | Admitting: Family Medicine

## 2013-09-02 DIAGNOSIS — F988 Other specified behavioral and emotional disorders with onset usually occurring in childhood and adolescence: Secondary | ICD-10-CM

## 2013-09-02 NOTE — Telephone Encounter (Signed)
Pt called and requested refill on ritalin. Call [phone] when ready.

## 2013-09-03 MED ORDER — AMPHETAMINE-DEXTROAMPHETAMINE 10 MG PO TABS: 10.0000 mg | ORAL_TABLET | Freq: Two times a day (BID) | ORAL | Status: DC

## 2013-09-03 NOTE — Telephone Encounter (Signed)
Adderall renewed.

## 2013-10-06 ENCOUNTER — Encounter: Payer: 59 | Admitting: Family Medicine

## 2013-12-09 ENCOUNTER — Telehealth: Payer: Self-pay | Admitting: Family Medicine

## 2013-12-09 DIAGNOSIS — F988 Other specified behavioral and emotional disorders with onset usually occurring in childhood and adolescence: Secondary | ICD-10-CM

## 2013-12-09 MED ORDER — AMPHETAMINE-DEXTROAMPHETAMINE 10 MG PO TABS: 10.0000 mg | ORAL_TABLET | Freq: Two times a day (BID) | ORAL | Status: DC

## 2013-12-09 NOTE — Telephone Encounter (Signed)
He will need a follow-up appointment before any more medication can be prescribed

## 2013-12-09 NOTE — Telephone Encounter (Signed)
I wrote one prescription. Let him know that he needs a follow-up appointment before any more meds can be prescribed.

## 2013-12-10 ENCOUNTER — Telehealth: Payer: Self-pay | Admitting: Family Medicine

## 2013-12-10 NOTE — Telephone Encounter (Signed)
lm

## 2013-12-11 NOTE — Telephone Encounter (Signed)
Left message on phone word for word

## 2014-01-13 ENCOUNTER — Encounter: Payer: Self-pay | Admitting: Family Medicine

## 2014-01-13 ENCOUNTER — Ambulatory Visit (INDEPENDENT_AMBULATORY_CARE_PROVIDER_SITE_OTHER): Payer: 59 | Admitting: Family Medicine

## 2014-01-13 VITALS — BP 120/80 | HR 70 | Wt 200.0 lb

## 2014-01-13 DIAGNOSIS — F988 Other specified behavioral and emotional disorders with onset usually occurring in childhood and adolescence: Secondary | ICD-10-CM

## 2014-01-13 DIAGNOSIS — E785 Hyperlipidemia, unspecified: Secondary | ICD-10-CM

## 2014-01-13 DIAGNOSIS — F17200 Nicotine dependence, unspecified, uncomplicated: Secondary | ICD-10-CM

## 2014-01-13 DIAGNOSIS — Z79899 Other long term (current) drug therapy: Secondary | ICD-10-CM

## 2014-01-13 DIAGNOSIS — Z720 Tobacco use: Secondary | ICD-10-CM

## 2014-01-13 DIAGNOSIS — F909 Attention-deficit hyperactivity disorder, unspecified type: Secondary | ICD-10-CM

## 2014-01-13 LAB — CBC WITH DIFFERENTIAL/PLATELET
Basophils Absolute: 0.1 10*3/uL (ref 0.0–0.1)
Basophils Relative: 1 % (ref 0–1)
EOS PCT: 1 % (ref 0–5)
Eosinophils Absolute: 0.1 10*3/uL (ref 0.0–0.7)
HCT: 48.7 % (ref 39.0–52.0)
HEMOGLOBIN: 17.4 g/dL — AB (ref 13.0–17.0)
LYMPHS ABS: 3.8 10*3/uL (ref 0.7–4.0)
LYMPHS PCT: 40 % (ref 12–46)
MCH: 32.8 pg (ref 26.0–34.0)
MCHC: 35.7 g/dL (ref 30.0–36.0)
MCV: 91.7 fL (ref 78.0–100.0)
MPV: 11.1 fL (ref 8.6–12.4)
Monocytes Absolute: 0.5 10*3/uL (ref 0.1–1.0)
Monocytes Relative: 5 % (ref 3–12)
Neutro Abs: 5 10*3/uL (ref 1.7–7.7)
Neutrophils Relative %: 53 % (ref 43–77)
Platelets: 154 10*3/uL (ref 150–400)
RBC: 5.31 MIL/uL (ref 4.22–5.81)
RDW: 13.2 % (ref 11.5–15.5)
WBC: 9.4 10*3/uL (ref 4.0–10.5)

## 2014-01-13 MED ORDER — AMPHETAMINE-DEXTROAMPHETAMINE 10 MG PO TABS: 10.0000 mg | ORAL_TABLET | Freq: Two times a day (BID) | ORAL | Status: DC

## 2014-01-13 NOTE — Progress Notes (Signed)
   Subjective:    Patient ID: Joshua Henry, male    DOB: [birth year], 49 y.o.   MRN: [id number]  HPI He is here for a medication check. He continues on Adderall unfortunately he does not know when it kicks in and when it wears out. He does take it twice per day because he supposed to! He has no withdrawal symptoms from it. He has switched to vaping and states that he has cut this down by one half. He does note difficulty with some of the devices giving him too much nicotine and causing throat discomfort. Review his record indicates elevated lipids. He was given some dietary information that he used very sparingly   Review of Systems     Objective:   Physical Exam Alert and in no distress otherwise not examined       Assessment & Plan:  ADD (attention deficit disorder) - Plan: amphetamine-dextroamphetamine (ADDERALL) 10 MG tablet, amphetamine-dextroamphetamine (ADDERALL) 10 MG tablet, amphetamine-dextroamphetamine (ADDERALL) 10 MG tablet  Current smoker  Hyperlipidemia LDL goal <130 - Plan: Lipid panel  Encounter for long-term (current) use of medications - Plan: CBC with Differential, Comprehensive metabolic panel, Lipid panel

## 2014-01-14 LAB — COMPREHENSIVE METABOLIC PANEL
ALBUMIN: 4 g/dL (ref 3.5–5.2)
ALK PHOS: 90 U/L (ref 39–117)
ALT: 38 U/L (ref 0–53)
AST: 28 U/L (ref 0–37)
BUN: 14 mg/dL (ref 6–23)
CO2: 24 mEq/L (ref 19–32)
Calcium: 9.2 mg/dL (ref 8.4–10.5)
Chloride: 103 mEq/L (ref 96–112)
Creat: 0.8 mg/dL (ref 0.50–1.35)
GLUCOSE: 77 mg/dL (ref 70–99)
POTASSIUM: 4.3 meq/L (ref 3.5–5.3)
Sodium: 135 mEq/L (ref 135–145)
TOTAL PROTEIN: 6.7 g/dL (ref 6.0–8.3)
Total Bilirubin: 0.4 mg/dL (ref 0.2–1.2)

## 2014-01-14 LAB — LIPID PANEL
Cholesterol: 253 mg/dL — ABNORMAL HIGH (ref 0–200)
HDL: 54 mg/dL (ref >39)
LDL CALC: 137 mg/dL — AB (ref 0–99)
TRIGLYCERIDES: 312 mg/dL — AB (ref <150)
Total CHOL/HDL Ratio: 4.7 Ratio
VLDL: 62 mg/dL — AB (ref 0–40)

## 2014-04-22 ENCOUNTER — Telehealth: Payer: Self-pay | Admitting: Family Medicine

## 2014-04-22 DIAGNOSIS — F988 Other specified behavioral and emotional disorders with onset usually occurring in childhood and adolescence: Secondary | ICD-10-CM

## 2014-04-22 NOTE — Telephone Encounter (Signed)
Pt called for refills of ritalin. Please call [phone] when ready.

## 2014-04-23 ENCOUNTER — Telehealth: Payer: Self-pay | Admitting: Family Medicine

## 2014-04-23 MED ORDER — AMPHETAMINE-DEXTROAMPHETAMINE 10 MG PO TABS: 10.0000 mg | ORAL_TABLET | Freq: Two times a day (BID) | ORAL | Status: DC

## 2014-04-23 NOTE — Telephone Encounter (Signed)
Left message Rx Adderall x3 ready for pick up

## 2014-08-04 ENCOUNTER — Telehealth: Payer: Self-pay | Admitting: Internal Medicine

## 2014-08-04 DIAGNOSIS — F988 Other specified behavioral and emotional disorders with onset usually occurring in childhood and adolescence: Secondary | ICD-10-CM

## 2014-08-04 MED ORDER — AMPHETAMINE-DEXTROAMPHETAMINE 10 MG PO TABS: 10.0000 mg | ORAL_TABLET | Freq: Two times a day (BID) | ORAL | Status: DC

## 2014-08-04 NOTE — Telephone Encounter (Signed)
Pt needs a refill his ADD meds. Call when ready @ [phone]

## 2014-11-16 ENCOUNTER — Telehealth: Payer: Self-pay | Admitting: Family Medicine

## 2014-11-16 DIAGNOSIS — F988 Other specified behavioral and emotional disorders with onset usually occurring in childhood and adolescence: Secondary | ICD-10-CM

## 2014-11-16 MED ORDER — AMPHETAMINE-DEXTROAMPHETAMINE 10 MG PO TABS: 10.0000 mg | ORAL_TABLET | Freq: Two times a day (BID) | ORAL | Status: DC

## 2014-11-16 NOTE — Telephone Encounter (Signed)
Pt called for refills of Adderall. Please call pt at [phone]

## 2015-01-06 DIAGNOSIS — H524 Presbyopia: Secondary | ICD-10-CM | POA: Diagnosis not present

## 2015-01-18 MED FILL — AMPHETAMINE SALTS 10 MG TAB: 10 | 30 days supply | Qty: 60 | Fill #0

## 2015-02-10 ENCOUNTER — Ambulatory Visit (INDEPENDENT_AMBULATORY_CARE_PROVIDER_SITE_OTHER): Payer: 59 | Admitting: Podiatry

## 2015-02-10 ENCOUNTER — Encounter: Payer: Self-pay | Admitting: Podiatry

## 2015-02-10 VITALS — BP 128/81 | HR 60 | Resp 16 | Ht 71.0 in | Wt 200.0 lb

## 2015-02-10 DIAGNOSIS — L309 Dermatitis, unspecified: Secondary | ICD-10-CM

## 2015-02-10 NOTE — Progress Notes (Signed)
Subjective:     Patient ID: Joshua Henry, male   DOB: 18-Nov-1965, 50 y.o.   MRN: [id number]  HPI patient states I have a lot of cracking on my heels and forefoot and that at times it causes me pain in both my feet   Review of Systems  All other systems reviewed and are negative.      Objective:   Physical Exam  Constitutional: He is oriented to person, place, and time.  Cardiovascular: Intact distal pulses.   Musculoskeletal: Normal range of motion.  Neurological: He is oriented to person, place, and time.  Skin: Skin is warm and dry.  Nursing note and vitals reviewed. neurovascular status found to be intact with muscle strength adequate and range of motion within normal limits. Patient is noted to have cracking around the heel region bilateral fifth metatarsal with mild fissures that are present but no indications of infective process. A family history with father having this condition and patient's found have good digital perfusion and is well oriented 3      Assessment:      dermatitis bilateral with dry skin and family history plain a role in this condition     Plan:     H&P x-ray reviewed with patient. We will start patient utilizing Vaseline with Saran wrap and white sock to be followed by revising did term treatment. We'll see back if symptoms persist

## 2015-02-10 NOTE — Progress Notes (Signed)
   Subjective:    Patient ID: Joshua Henry, male    DOB: 09/30/1965, 50 y.o.   MRN: [id number]  HPI Patient presents with a bilateral skin condition; plantar forefoot & heels. Pt stated, "has cracks in skin causing pain in foot".   Review of Systems  Respiratory: Positive for cough.   Neurological: Positive for headaches.  All other systems reviewed and are negative.      Objective:   Physical Exam        Assessment & Plan:

## 2015-02-22 ENCOUNTER — Ambulatory Visit (INDEPENDENT_AMBULATORY_CARE_PROVIDER_SITE_OTHER): Payer: 59 | Admitting: Family Medicine

## 2015-02-22 ENCOUNTER — Encounter: Payer: Self-pay | Admitting: Family Medicine

## 2015-02-22 VITALS — BP 162/100 | HR 90 | Ht 71.0 in | Wt 195.8 lb

## 2015-02-22 DIAGNOSIS — J309 Allergic rhinitis, unspecified: Secondary | ICD-10-CM

## 2015-02-22 DIAGNOSIS — F909 Attention-deficit hyperactivity disorder, unspecified type: Secondary | ICD-10-CM

## 2015-02-22 DIAGNOSIS — F1010 Alcohol abuse, uncomplicated: Secondary | ICD-10-CM | POA: Diagnosis not present

## 2015-02-22 DIAGNOSIS — E785 Hyperlipidemia, unspecified: Secondary | ICD-10-CM | POA: Diagnosis not present

## 2015-02-22 DIAGNOSIS — R030 Elevated blood-pressure reading, without diagnosis of hypertension: Secondary | ICD-10-CM | POA: Diagnosis not present

## 2015-02-22 DIAGNOSIS — Z0000 Encounter for general adult medical examination without abnormal findings: Secondary | ICD-10-CM | POA: Diagnosis not present

## 2015-02-22 DIAGNOSIS — IMO0001 Reserved for inherently not codable concepts without codable children: Secondary | ICD-10-CM

## 2015-02-22 DIAGNOSIS — Z720 Tobacco use: Secondary | ICD-10-CM | POA: Diagnosis not present

## 2015-02-22 DIAGNOSIS — F988 Other specified behavioral and emotional disorders with onset usually occurring in childhood and adolescence: Secondary | ICD-10-CM

## 2015-02-22 DIAGNOSIS — F17200 Nicotine dependence, unspecified, uncomplicated: Secondary | ICD-10-CM

## 2015-02-22 DIAGNOSIS — Z1211 Encounter for screening for malignant neoplasm of colon: Secondary | ICD-10-CM

## 2015-02-22 DIAGNOSIS — Z7289 Other problems related to lifestyle: Secondary | ICD-10-CM | POA: Insufficient documentation

## 2015-02-22 DIAGNOSIS — Z789 Other specified health status: Secondary | ICD-10-CM | POA: Insufficient documentation

## 2015-02-22 LAB — CBC WITH DIFFERENTIAL/PLATELET
BASOS ABS: 0 10*3/uL (ref 0.0–0.1)
BASOS PCT: 0 % (ref 0–1)
Eosinophils Absolute: 0.1 10*3/uL (ref 0.0–0.7)
Eosinophils Relative: 1 % (ref 0–5)
HCT: 49.2 % (ref 39.0–52.0)
HEMOGLOBIN: 17.5 g/dL — AB (ref 13.0–17.0)
Lymphocytes Relative: 35 % (ref 12–46)
Lymphs Abs: 2.5 10*3/uL (ref 0.7–4.0)
MCH: 32.7 pg (ref 26.0–34.0)
MCHC: 35.6 g/dL (ref 30.0–36.0)
MCV: 92 fL (ref 78.0–100.0)
MONO ABS: 0.4 10*3/uL (ref 0.1–1.0)
MPV: 10.9 fL (ref 8.6–12.4)
Monocytes Relative: 5 % (ref 3–12)
NEUTROS ABS: 4.2 10*3/uL (ref 1.7–7.7)
NEUTROS PCT: 59 % (ref 43–77)
Platelets: 151 10*3/uL (ref 150–400)
RBC: 5.35 MIL/uL (ref 4.22–5.81)
RDW: 13.5 % (ref 11.5–15.5)
WBC: 7.2 10*3/uL (ref 4.0–10.5)

## 2015-02-22 NOTE — Progress Notes (Signed)
Subjective:    Patient ID: Joshua Henry, male    DOB: 1965-11-24, 50 y.o.   MRN: [id number]  HPI He is here for complete examination.Does have underlying ADD and continues to do well on his medications. He uses it mainly to stay focused well at work. He does have underlying allergies but has this under fairly good control. He uses OTC meds for this. He continues to smoke and Vape at home. Presently he is not ready to quit smoking. He drinks a 6 pack of beer daily except on Sundays. He does have a previous history of hyperlipidemia. He has lost a few pounds recently. Family and social history, health maintenance and immunizations were reviewed. He's had no difficulty with chest pain, shortness of breath, abdominal pain, vomiting or diarrhea.His work goes well. It does keep very physically active.   Review of Systems  All other systems reviewed and are negative.      Objective:   Physical Exam BP 162/100 mmHg  Pulse 90  Ht 5\' 11"  (1.803 m)  Wt 195 lb 12.8 oz (88.814 kg)  BMI 27.32 kg/m2  SpO2 98%  General Appearance:    Alert, cooperative, no distress, appears stated age  Head:    Normocephalic, without obvious abnormality, atraumatic  Eyes:    PERRL, conjunctiva/corneas clear, EOM's intact, fundi    benign  Ears:    Normal TM's and external ear canals  Nose:   Nares normal, mucosa normal, no drainage or sinus   tenderness  Throat:   Lips, mucosa, and tongue normal; teeth and gums normal  Neck:   Supple, no lymphadenopathy;  thyroid:  no   enlargement/tenderness/nodules; no carotid   bruit or JVD  Back:    Spine nontender, no curvature, ROM normal, no CVA     tenderness  Lungs:     Clear to auscultation bilaterally without wheezes, rales or     ronchi; respirations unlabored  Chest Wall:    No tenderness or deformity   Heart:    Regular rate and rhythm, S1 and S2 normal, no murmur, rub   or gallop  Breast Exam:    No chest wall tenderness, masses or gynecomastia  Abdomen:      Soft, non-tender, nondistended, normoactive bowel sounds,    no masses, no hepatosplenomegaly        Extremities:   No clubbing, cyanosis or edema  Pulses:   2+ and symmetric all extremities  Skin:   Skin color, texture, turgor normal, no rashes or lesions  Lymph nodes:   Cervical, supraclavicular, and axillary nodes normal  Neurologic:   CNII-XII intact, normal strength, sensation and gait; reflexes 2+ and symmetric throughout          Psych:   Normal mood, affect, hygiene and grooming.          Assessment & Plan:  Routine general medical examination at a health care facility - Plan: CBC with Differential/Platelet, Comprehensive metabolic panel, Lipid panel  Elevated blood pressure - Plan: CBC with Differential/Platelet, Comprehensive metabolic panel  ADD (attention deficit disorder)  Current smoker  Hyperlipidemia LDL goal <130 - Plan: Lipid panel  Alcohol abuse, daily use I discussed his smoking and at this time he is not interested in quitting. Also discussed alcohol consumption in regard to his elevated blood pressure, weight. At this time he is really reluctant to cut back on his alcohol consumption.He is to return here in one month for recheck on his blood pressure and bring his  home blood pressure cuff. He is also  to be set up for colonoscopy after he turns 61.

## 2015-02-23 LAB — LIPID PANEL
Cholesterol: 229 mg/dL — ABNORMAL HIGH (ref 125–200)
HDL: 38 mg/dL — ABNORMAL LOW (ref >=40)
LDL CALC: 124 mg/dL (ref <130)
TRIGLYCERIDES: 333 mg/dL — AB (ref <150)
Total CHOL/HDL Ratio: 6 Ratio — ABNORMAL HIGH (ref <=5.0)
VLDL: 67 mg/dL — ABNORMAL HIGH (ref <30)

## 2015-02-23 LAB — COMPREHENSIVE METABOLIC PANEL
ALBUMIN: 4 g/dL (ref 3.6–5.1)
ALT: 24 U/L (ref 9–46)
AST: 21 U/L (ref 10–40)
Alkaline Phosphatase: 82 U/L (ref 40–115)
BILIRUBIN TOTAL: 0.5 mg/dL (ref 0.2–1.2)
BUN: 9 mg/dL (ref 7–25)
CO2: 25 mmol/L (ref 20–31)
CREATININE: 0.87 mg/dL (ref 0.60–1.35)
Calcium: 8.7 mg/dL (ref 8.6–10.3)
Chloride: 104 mmol/L (ref 98–110)
GLUCOSE: 105 mg/dL — AB (ref 65–99)
Potassium: 4.3 mmol/L (ref 3.5–5.3)
SODIUM: 137 mmol/L (ref 135–146)
Total Protein: 6.8 g/dL (ref 6.1–8.1)

## 2015-02-25 ENCOUNTER — Telehealth: Payer: Self-pay | Admitting: Family Medicine

## 2015-02-25 NOTE — Telephone Encounter (Signed)
Pt called and was requesting a refill on his adderall said he should have asked for it the other day when he was here, told him you was not here today or tomorrow, pt can be reached at [phone] when ready to be picked up

## 2015-02-26 ENCOUNTER — Other Ambulatory Visit: Payer: Self-pay | Admitting: Medical

## 2015-02-26 DIAGNOSIS — F988 Other specified behavioral and emotional disorders with onset usually occurring in childhood and adolescence: Secondary | ICD-10-CM

## 2015-02-26 MED ORDER — AMPHETAMINE-DEXTROAMPHETAMINE 10 MG PO TABS: 10.0000 mg | ORAL_TABLET | Freq: Two times a day (BID) | ORAL | Status: DC

## 2015-02-26 NOTE — Telephone Encounter (Signed)
Called and informed pt.  

## 2015-02-26 NOTE — Telephone Encounter (Signed)
Rx ready since Dr. Redmond School is out.   I only wrote for 1 month supply.

## 2015-03-04 MED FILL — AMPHETAMINE SALTS 10 MG TAB: 10 | 30 days supply | Qty: 60 | Fill #0

## 2015-03-08 ENCOUNTER — Other Ambulatory Visit: Payer: 59

## 2015-03-08 ENCOUNTER — Telehealth: Payer: Self-pay | Admitting: *Deleted

## 2015-03-08 DIAGNOSIS — I10 Essential (primary) hypertension: Secondary | ICD-10-CM

## 2015-03-08 MED ORDER — HYDROCHLOROTHIAZIDE 12.5 MG PO CAPS: 12.5000 mg | ORAL_CAPSULE | Freq: Every day | ORAL | Status: DC

## 2015-03-08 MED FILL — HYDROCHLOROTHIAZIDE 12.5 MG: 12.5 | 90 days supply | Qty: 90 | Fill #0 | Status: TO

## 2015-03-08 NOTE — Telephone Encounter (Addendum)
Patient came in for nurse visit to verify his bp machine. I got 170/96 and his machine read 163/93. I spoke with Dr.Lalonde and he is going to start patient on bp medication and patient is to call in one month with his numbers-went over this with patient and her verbalized.

## 2015-03-10 DIAGNOSIS — D225 Melanocytic nevi of trunk: Secondary | ICD-10-CM | POA: Diagnosis not present

## 2015-03-10 DIAGNOSIS — L821 Other seborrheic keratosis: Secondary | ICD-10-CM | POA: Diagnosis not present

## 2015-03-10 DIAGNOSIS — D2372 Other benign neoplasm of skin of left lower limb, including hip: Secondary | ICD-10-CM | POA: Diagnosis not present

## 2015-03-10 DIAGNOSIS — M720 Palmar fascial fibromatosis [Dupuytren]: Secondary | ICD-10-CM | POA: Diagnosis not present

## 2015-03-10 DIAGNOSIS — L814 Other melanin hyperpigmentation: Secondary | ICD-10-CM | POA: Diagnosis not present

## 2015-03-10 DIAGNOSIS — L570 Actinic keratosis: Secondary | ICD-10-CM | POA: Diagnosis not present

## 2015-03-10 DIAGNOSIS — D485 Neoplasm of uncertain behavior of skin: Secondary | ICD-10-CM | POA: Diagnosis not present

## 2015-03-10 DIAGNOSIS — D2371 Other benign neoplasm of skin of right lower limb, including hip: Secondary | ICD-10-CM | POA: Diagnosis not present

## 2015-03-10 DIAGNOSIS — L853 Xerosis cutis: Secondary | ICD-10-CM | POA: Diagnosis not present

## 2015-03-10 DIAGNOSIS — D2272 Melanocytic nevi of left lower limb, including hip: Secondary | ICD-10-CM | POA: Diagnosis not present

## 2015-04-06 ENCOUNTER — Telehealth: Payer: Self-pay | Admitting: Family Medicine

## 2015-04-06 DIAGNOSIS — F988 Other specified behavioral and emotional disorders with onset usually occurring in childhood and adolescence: Secondary | ICD-10-CM

## 2015-04-06 MED ORDER — AMPHETAMINE-DEXTROAMPHETAMINE 10 MG PO TABS: 10.0000 mg | ORAL_TABLET | Freq: Two times a day (BID) | ORAL | Status: DC

## 2015-04-06 MED ORDER — AMPHETAMINE-DEXTROAMPHETAMINE 10 MG PO TABS
10.0000 mg | ORAL_TABLET | Freq: Two times a day (BID) | ORAL | Status: DC
Start: 2015-04-06 — End: 2015-07-21

## 2015-04-06 NOTE — Telephone Encounter (Signed)
Needs refill on adderal   Call when ready

## 2015-04-09 MED FILL — DEXTROAMP-AMP 10 MG TAB: 10 | 30 days supply | Qty: 60 | Fill #0

## 2015-04-22 ENCOUNTER — Encounter: Payer: Self-pay | Admitting: Family Medicine

## 2015-05-17 MED FILL — DEXTROAMP-AMP 10 MG TAB: 10 | 30 days supply | Qty: 60 | Fill #0

## 2015-06-09 MED FILL — HYDROCHLOROTHIAZIDE 12.5 MG: 12.5 | 90 days supply | Qty: 90 | Fill #0

## 2015-06-17 MED FILL — DEXTROAMP-AMP 10 MG TAB: 10 | 30 days supply | Qty: 60 | Fill #0

## 2015-07-21 ENCOUNTER — Telehealth: Payer: Self-pay | Admitting: Family Medicine

## 2015-07-21 DIAGNOSIS — F988 Other specified behavioral and emotional disorders with onset usually occurring in childhood and adolescence: Secondary | ICD-10-CM

## 2015-07-21 MED ORDER — AMPHETAMINE-DEXTROAMPHETAMINE 10 MG PO TABS: 10.0000 mg | ORAL_TABLET | Freq: Two times a day (BID) | ORAL | Status: DC

## 2015-07-21 NOTE — Telephone Encounter (Signed)
Pt called and states that he needs a refill on his ADDERALL and would like today i informed him that you was out of the office, and he states that you could function without being out of the office. States that you normally get someone else fill it if your not in the office. Pt can be reached at [phone] (M) when ready to be picked up, states he did not want to wait till tomorrow. i also informed him that you have to be the one to sign the RX.

## 2015-07-22 ENCOUNTER — Telehealth: Payer: Self-pay | Admitting: Family Medicine

## 2015-07-22 NOTE — Telephone Encounter (Signed)
Pt informed rx is ready.  

## 2015-07-23 MED FILL — DEXTROAMP-AMP 10 MG TAB: 10 | 30 days supply | Qty: 60 | Fill #0

## 2015-08-09 ENCOUNTER — Encounter: Payer: Self-pay | Admitting: Gastroenterology

## 2015-08-25 MED FILL — DEXTROAMP-AMP 10 MG TAB: 10 | 30 days supply | Qty: 60 | Fill #0

## 2015-09-16 MED FILL — HYDROCHLOROTHIAZIDE 12.5 MG: 12.5 | 90 days supply | Qty: 90 | Fill #1 | Status: TO

## 2015-09-27 ENCOUNTER — Ambulatory Visit (AMBULATORY_SURGERY_CENTER): Payer: Self-pay

## 2015-09-27 VITALS — Ht 70.0 in | Wt 202.6 lb

## 2015-09-27 DIAGNOSIS — Z800 Family history of malignant neoplasm of digestive organs: Secondary | ICD-10-CM

## 2015-09-27 MED ORDER — SUPREP BOWEL PREP KIT 17.5-3.13-1.6 GM/177ML PO SOLN: 1.0000 | Freq: Once | ORAL | 0 refills | Status: AC

## 2015-09-27 NOTE — Progress Notes (Signed)
No allergies to eggs or soy No past problems with anesthesia No diet meds No home oxygen  Has email and internet ; registered emmi

## 2015-09-29 ENCOUNTER — Telehealth: Payer: Self-pay | Admitting: Gastroenterology

## 2015-09-30 MED FILL — AMPHETAMINE SALTS 10 MG TAB: 10 | 30 days supply | Qty: 60 | Fill #0

## 2015-10-01 NOTE — Telephone Encounter (Signed)
Coupon (pay no more than $50) faxed to [employer].

## 2015-10-05 ENCOUNTER — Telehealth: Payer: Self-pay | Admitting: Gastroenterology

## 2015-10-06 ENCOUNTER — Other Ambulatory Visit: Payer: Self-pay

## 2015-10-06 MED ORDER — PEG 3350-KCL-NA BICARB-NACL 420 G PO SOLR: 4000.0000 mL | Freq: Once | ORAL | 0 refills | Status: AC

## 2015-10-06 NOTE — Telephone Encounter (Signed)
Left message to return call for alternative prep [facility]) advised pt we will have to give new directions as well as a new RX for the prep.

## 2015-10-08 MED FILL — GAVILYTE-N SOLUTION: 420 | 1 days supply | Qty: 4000 | Fill #0

## 2015-10-11 ENCOUNTER — Ambulatory Visit (AMBULATORY_SURGERY_CENTER): Payer: 59 | Admitting: Gastroenterology

## 2015-10-11 ENCOUNTER — Encounter: Payer: Self-pay | Admitting: Gastroenterology

## 2015-10-11 VITALS — BP 149/90 | HR 57 | Temp 96.8°F | Resp 16 | Ht 70.0 in | Wt 202.0 lb

## 2015-10-11 DIAGNOSIS — Z1211 Encounter for screening for malignant neoplasm of colon: Secondary | ICD-10-CM | POA: Diagnosis present

## 2015-10-11 DIAGNOSIS — Z800 Family history of malignant neoplasm of digestive organs: Secondary | ICD-10-CM | POA: Diagnosis not present

## 2015-10-11 DIAGNOSIS — D123 Benign neoplasm of transverse colon: Secondary | ICD-10-CM | POA: Diagnosis not present

## 2015-10-11 DIAGNOSIS — F900 Attention-deficit hyperactivity disorder, predominantly inattentive type: Secondary | ICD-10-CM | POA: Diagnosis not present

## 2015-10-11 DIAGNOSIS — E669 Obesity, unspecified: Secondary | ICD-10-CM | POA: Diagnosis not present

## 2015-10-11 DIAGNOSIS — I10 Essential (primary) hypertension: Secondary | ICD-10-CM | POA: Diagnosis not present

## 2015-10-11 DIAGNOSIS — Z1212 Encounter for screening for malignant neoplasm of rectum: Secondary | ICD-10-CM | POA: Diagnosis not present

## 2015-10-11 MED ORDER — SODIUM CHLORIDE 0.9 % IV SOLN: 500.0000 mL | INTRAVENOUS | Status: DC

## 2015-10-11 NOTE — Progress Notes (Signed)
Called to room to assist during endoscopic procedure.  Patient ID and intended procedure confirmed with present staff. Received instructions for my participation in the procedure from the performing physician.  

## 2015-10-11 NOTE — Progress Notes (Signed)
Report to PACU, RN, vss, BBS= Clear.  

## 2015-10-11 NOTE — Patient Instructions (Signed)
Handouts given:  Polyps   YOU HAD AN ENDOSCOPIC PROCEDURE TODAY AT THE [facility]:   Refer to the procedure report that was given to you for any specific questions about what was found during the examination.  If the procedure report does not answer your questions, please call your gastroenterologist to clarify.  If you requested that your care partner not be given the details of your procedure findings, then the procedure report has been included in a sealed envelope for you to review at your convenience later.  YOU SHOULD EXPECT: Some feelings of bloating in the abdomen. Passage of more gas than usual.  Walking can help get rid of the air that was put into your GI tract during the procedure and reduce the bloating. If you had a lower endoscopy (such as a colonoscopy or flexible sigmoidoscopy) you may notice spotting of blood in your stool or on the toilet paper. If you underwent a bowel prep for your procedure, you may not have a normal bowel movement for a few days.  Please Note:  You might notice some irritation and congestion in your nose or some drainage.  This is from the oxygen used during your procedure.  There is no need for concern and it should clear up in a day or so.  SYMPTOMS TO REPORT IMMEDIATELY:   Following lower endoscopy (colonoscopy or flexible sigmoidoscopy):  Excessive amounts of blood in the stool  Significant tenderness or worsening of abdominal pains  Swelling of the abdomen that is new, acute  Fever of 100F or higher   For urgent or emergent issues, a gastroenterologist can be reached at any hour by calling [phone].   DIET:  We do recommend a small meal at first, but then you may proceed to your regular diet.  Drink plenty of fluids but you should avoid alcoholic beverages for 24 hours.  ACTIVITY:  You should plan to take it easy for the rest of today and you should NOT DRIVE or use heavy machinery until tomorrow (because of the sedation  medicines used during the test).    FOLLOW UP: Our staff will call the number listed on your records the next business day following your procedure to check on you and address any questions or concerns that you may have regarding the information given to you following your procedure. If we do not reach you, we will leave a message.  However, if you are feeling well and you are not experiencing any problems, there is no need to return our call.  We will assume that you have returned to your regular daily activities without incident.  If any biopsies were taken you will be contacted by phone or by letter within the next 1-3 weeks.  Please call us at [phone] if you have not heard about the biopsies in 3 weeks.    SIGNATURES/CONFIDENTIALITY: You and/or your care partner have signed paperwork which will be entered into your electronic medical record.  These signatures attest to the fact that that the information above on your After Visit Summary has been reviewed and is understood.  Full responsibility of the confidentiality of this discharge information lies with you and/or your care-partner. 

## 2015-10-11 NOTE — Op Note (Signed)
[facility] Patient Name: Joshua Henry Procedure Date: 10/11/2015 7:54 AM MRN: [id number] Endoscopist: Mallie Mussel L. Loletha Carrow , MD Age: 50 Referring MD:  Date of Birth: 1965-10-20 Gender: Male Account #: [id number] Procedure:                Colonoscopy Indications:              Screening for colorectal malignant neoplasm, This                            is the patient's first colonoscopy Medicines:                Monitored Anesthesia Care Procedure:                Pre-Anesthesia Assessment:                           - Prior to the procedure, a History and Physical                            was performed, and patient medications and                            allergies were reviewed. The patient's tolerance of                            previous anesthesia was also reviewed. The risks                            and benefits of the procedure and the sedation                            options and risks were discussed with the patient.                            All questions were answered, and informed consent                            was obtained. Prior Anticoagulants: The patient has                            taken no previous anticoagulant or antiplatelet                            agents. ASA Grade Assessment: II - A patient with                            mild systemic disease. After reviewing the risks                            and benefits, the patient was deemed in                            satisfactory condition to undergo the procedure.  After obtaining informed consent, the colonoscope                            was passed under direct vision. Throughout the                            procedure, the patient's blood pressure, pulse, and                            oxygen saturations were monitored continuously. The                            Model CF-HQ190L [id number]) scope was introduced                            through the anus and  advanced to the the cecum,                            identified by appendiceal orifice and ileocecal                            valve. The colonoscopy was performed without                            difficulty. The patient tolerated the procedure                            well. The quality of the bowel preparation was                            good. The ileocecal valve, appendiceal orifice, and                            rectum were photographed. The quality of the bowel                            preparation was evaluated using the BBPS [facility]                            Bowel Preparation Scale) with scores of: Right                            Colon = 2, Transverse Colon = 2 and Left Colon = 2.                            The total BBPS score equals 6. The bowel                            preparation used was SUPREP. Scope In: 8:02:40 AM Scope Out: 8:21:04 AM Scope Withdrawal Time: 0 hours 14 minutes 54 seconds  Total Procedure Duration: 0 hours 18 minutes 24 seconds  Findings:                 The perianal  and digital rectal examinations were                            normal.                           A 4 mm polyp was found in the splenic flexure. The                            polyp was sessile. The polyp was removed with a                            cold snare. Resection and retrieval were complete.                           The exam was otherwise without abnormality on                            direct and retroflexion views. Complications:            No immediate complications. Estimated Blood Loss:     Estimated blood loss: none. Impression:               - One 4 mm polyp at the splenic flexure, removed                            with a cold snare. Resected and retrieved.                           - The examination was otherwise normal on direct                            and retroflexion views. Recommendation:           - Patient has a contact number available for                             emergencies. The signs and symptoms of potential                            delayed complications were discussed with the                            patient. Return to normal activities tomorrow.                            Written discharge instructions were provided to the                            patient.                           - Resume previous diet.                           - Continue present medications.                           -  Await pathology results.                           - Repeat colonoscopy is recommended for                            surveillance. The colonoscopy date will be                            determined after pathology results from today's                            exam become available for review. Seven Dollens L. Loletha Carrow, MD 10/11/2015 8:25:16 AM This report has been signed electronically.

## 2015-10-12 ENCOUNTER — Telehealth: Payer: Self-pay | Admitting: *Deleted

## 2015-10-12 ENCOUNTER — Telehealth: Payer: Self-pay

## 2015-10-12 NOTE — Telephone Encounter (Signed)
Message left

## 2015-10-12 NOTE — Telephone Encounter (Signed)
  Follow up Call-  Call back number 10/11/2015  Post procedure Call Back phone  # [phone]  Permission to leave phone message Yes  Some recent data might be hidden     Patient questions:  Do you have a fever, pain , or abdominal swelling? No. Pain Score  0 *  Have you tolerated food without any problems? Yes.    Have you been able to return to your normal activities? Yes.    Do you have any questions about your discharge instructions: Diet   No. Medications  No. Follow up visit  No.  Do you have questions or concerns about your Care? No.  Actions: * If pain score is 4 or above: No action needed, pain <4.

## 2015-10-15 ENCOUNTER — Encounter: Payer: Self-pay | Admitting: Gastroenterology

## 2015-11-03 ENCOUNTER — Telehealth: Payer: Self-pay | Admitting: Family Medicine

## 2015-11-03 DIAGNOSIS — F988 Other specified behavioral and emotional disorders with onset usually occurring in childhood and adolescence: Secondary | ICD-10-CM

## 2015-11-03 NOTE — Telephone Encounter (Signed)
Pt called requesting a refill on his ADDERALL pt can be reached at [phone] when ready to pick up

## 2015-11-04 MED ORDER — AMPHETAMINE-DEXTROAMPHETAMINE 10 MG PO TABS: 10.0000 mg | ORAL_TABLET | Freq: Two times a day (BID) | ORAL | 0 refills | Status: DC

## 2015-11-05 ENCOUNTER — Telehealth: Payer: Self-pay | Admitting: Family Medicine

## 2015-11-05 MED FILL — DEXTROAMP-AMP 10 MG TAB: 10 | 30 days supply | Qty: 60 | Fill #0

## 2015-11-05 NOTE — Telephone Encounter (Signed)
Pt wife dropped by and states that Joshua Henry had a colonoscopy in October and is concerned about precancer cells that where reported from the colonoscopy  would like you to give Joshua Henry a call about this pt can be reached at [phone]

## 2015-12-09 ENCOUNTER — Telehealth: Payer: Self-pay | Admitting: Family Medicine

## 2015-12-09 DIAGNOSIS — F988 Other specified behavioral and emotional disorders with onset usually occurring in childhood and adolescence: Secondary | ICD-10-CM

## 2015-12-09 MED ORDER — AMPHETAMINE-DEXTROAMPHETAMINE 10 MG PO TABS: 10.0000 mg | ORAL_TABLET | Freq: Two times a day (BID) | ORAL | 0 refills | Status: DC

## 2015-12-09 NOTE — Telephone Encounter (Signed)
Requesting refill on Adderall 10 mg. Call pt at [phone] when script is ready for pick up

## 2015-12-10 ENCOUNTER — Telehealth: Payer: Self-pay

## 2015-12-10 NOTE — Telephone Encounter (Signed)
LM on VCM that rx placed at front desk for pick up. Joshua Henry

## 2015-12-14 MED FILL — DEXTROAMP-AMPHETAMIN 10 MG: 10 | 30 days supply | Qty: 60 | Fill #0

## 2016-01-17 ENCOUNTER — Telehealth: Payer: Self-pay | Admitting: Internal Medicine

## 2016-01-17 MED FILL — DEXTROAMP-AMPHETAMIN 10 MG: 10 | 30 days supply | Qty: 60 | Fill #0

## 2016-01-17 NOTE — Telephone Encounter (Signed)
P.A. Done and Approved for Adderall 10mg  Tablets   Approved 12/18/15- 01/16/19

## 2016-02-02 MED FILL — HYDROCHLOROTHIAZIDE 12.5 MG: 12.5 | 30 days supply | Qty: 30 | Fill #0

## 2016-03-06 MED FILL — DEXTROAMP-AMPHETAMIN 10 MG: 10 | 30 days supply | Qty: 60 | Fill #0

## 2016-03-14 ENCOUNTER — Telehealth: Payer: Self-pay | Admitting: Family Medicine

## 2016-03-14 ENCOUNTER — Other Ambulatory Visit: Payer: Self-pay | Admitting: Family Medicine

## 2016-03-14 DIAGNOSIS — I10 Essential (primary) hypertension: Secondary | ICD-10-CM

## 2016-03-14 MED ORDER — HYDROCHLOROTHIAZIDE 12.5 MG PO CAPS: 12.5000 mg | ORAL_CAPSULE | Freq: Every day | ORAL | 0 refills | Status: DC

## 2016-03-14 MED FILL — HYDROCHLOROTHIAZIDE 12.5 MG: 12.5 | 30 days supply | Qty: 30 | Fill #0

## 2016-03-14 NOTE — Telephone Encounter (Signed)
done

## 2016-03-14 NOTE — Telephone Encounter (Signed)
Pt's wife made CPE appt for 04/18/16. Pt need refill on Hydrochlorothiazide 12.5 mg now at [facility] to last until appt

## 2016-04-12 ENCOUNTER — Telehealth: Payer: Self-pay | Admitting: Family Medicine

## 2016-04-12 DIAGNOSIS — F988 Other specified behavioral and emotional disorders with onset usually occurring in childhood and adolescence: Secondary | ICD-10-CM

## 2016-04-12 MED ORDER — AMPHETAMINE-DEXTROAMPHETAMINE 10 MG PO TABS: 10.0000 mg | ORAL_TABLET | Freq: Two times a day (BID) | ORAL | 0 refills | Status: DC

## 2016-04-12 NOTE — Telephone Encounter (Signed)
Pt called and moved his cpe due to going out of town for work he is now schedule for June which is when [place] first available is. Pt needs refills of adderall. Please call [phone] when ready.

## 2016-04-13 ENCOUNTER — Telehealth: Payer: Self-pay

## 2016-04-13 NOTE — Telephone Encounter (Signed)
Scripts placed at front desk for pick up. PT aware. Victorino December

## 2016-04-14 MED FILL — AMPHETAMINE SALTS 10 MG TAB: 10 | 30 days supply | Qty: 60 | Fill #0

## 2016-04-18 ENCOUNTER — Encounter: Payer: 59 | Admitting: Family Medicine

## 2016-05-03 MED FILL — HYDROCHLOROTHIAZIDE 12.5 MG: 12.5 | 30 days supply | Qty: 30 | Fill #0

## 2016-05-19 MED FILL — DEXTROAMP-AMP 10 MG TAB: 10 | 30 days supply | Qty: 60 | Fill #0

## 2016-06-09 ENCOUNTER — Encounter: Payer: Self-pay | Admitting: Family Medicine

## 2016-06-09 ENCOUNTER — Ambulatory Visit (INDEPENDENT_AMBULATORY_CARE_PROVIDER_SITE_OTHER): Payer: BLUE CROSS/BLUE SHIELD | Admitting: Family Medicine

## 2016-06-09 VITALS — BP 128/80 | HR 78 | Ht 70.5 in | Wt 207.0 lb

## 2016-06-09 DIAGNOSIS — I10 Essential (primary) hypertension: Secondary | ICD-10-CM

## 2016-06-09 DIAGNOSIS — Z860100 Personal history of colon polyps, unspecified: Secondary | ICD-10-CM | POA: Insufficient documentation

## 2016-06-09 DIAGNOSIS — Z0000 Encounter for general adult medical examination without abnormal findings: Secondary | ICD-10-CM | POA: Diagnosis not present

## 2016-06-09 DIAGNOSIS — F1010 Alcohol abuse, uncomplicated: Secondary | ICD-10-CM

## 2016-06-09 DIAGNOSIS — E785 Hyperlipidemia, unspecified: Secondary | ICD-10-CM

## 2016-06-09 DIAGNOSIS — F17200 Nicotine dependence, unspecified, uncomplicated: Secondary | ICD-10-CM

## 2016-06-09 DIAGNOSIS — F988 Other specified behavioral and emotional disorders with onset usually occurring in childhood and adolescence: Secondary | ICD-10-CM | POA: Diagnosis not present

## 2016-06-09 LAB — POCT URINALYSIS DIPSTICK
BILIRUBIN UA: NEGATIVE
Blood, UA: NEGATIVE
GLUCOSE UA: NEGATIVE
Ketones, UA: NEGATIVE
LEUKOCYTES UA: NEGATIVE
NITRITE UA: NEGATIVE
PH UA: 6 (ref 5.0–8.0)
Protein, UA: NEGATIVE
Spec Grav, UA: 1.025 (ref 1.010–1.025)
Urobilinogen, UA: 0.2 E.U./dL

## 2016-06-09 LAB — CBC WITH DIFFERENTIAL/PLATELET
BASOS PCT: 0 %
Basophils Absolute: 0 cells/uL (ref 0–200)
EOS ABS: 75 {cells}/uL (ref 15–500)
Eosinophils Relative: 1 %
HCT: 49.9 % (ref 38.5–50.0)
Hemoglobin: 17 g/dL (ref 13.2–17.1)
Lymphocytes Relative: 31 %
Lymphs Abs: 2325 cells/uL (ref 850–3900)
MCH: 31.5 pg (ref 27.0–33.0)
MCHC: 34.1 g/dL (ref 32.0–36.0)
MCV: 92.4 fL (ref 80.0–100.0)
MONOS PCT: 7 %
MPV: 11.4 fL (ref 7.5–12.5)
Monocytes Absolute: 525 cells/uL (ref 200–950)
NEUTROS ABS: 4575 {cells}/uL (ref 1500–7800)
Neutrophils Relative %: 61 %
PLATELETS: 154 10*3/uL (ref 140–400)
RBC: 5.4 MIL/uL (ref 4.20–5.80)
RDW: 13.6 % (ref 11.0–15.0)
WBC: 7.5 10*3/uL (ref 4.0–10.5)

## 2016-06-09 MED ORDER — HYDROCHLOROTHIAZIDE 12.5 MG PO CAPS: 12.5000 mg | ORAL_CAPSULE | Freq: Every day | ORAL | 3 refills | Status: DC

## 2016-06-09 MED FILL — HYDROCHLOROTHIAZIDE 12.5 MG: 12.5 | 30 days supply | Qty: 30 | Fill #0

## 2016-06-09 NOTE — Patient Instructions (Signed)
Discriminate against "white food"

## 2016-06-09 NOTE — Progress Notes (Signed)
Subjective:    Patient ID: Joshua Henry, male    DOB: [birth year], 51 y.o.   MRN: [id number]  HPI He is here for complete examination. He did have a colonoscopy last year which did show tubular adenoma and he is on a 5 year cycle. He does have underlying ADD and uses Adderall twice per day. The medication lasts around 4 hours and he has no problems with this. Present dosing regimen has been unchanged and he is quite happy with that. He continues to drink roughly a 6 pack a day of beer and has no plans to quit. He continues to smoke and again is at least thinking about quitting but has not committed to it yet. He continues on HCTZ for his hypertension. He now working first shift which she says is helping with his sleep pattern and also with his weight. He does note that he has probably lost several pounds in the last few months because of this. He has been married for slightly under 30 years. The marriage is going well. He has had no urinary or sexual difficulties. He does have his daughter back at home with her 19-month-old son. This seems to be going well. Family and social history as well as health maintenance and immunizations were otherwise reviewed.   Review of Systems  All other systems reviewed and are negative.      Objective:   Physical Exam BP 128/80   Pulse 78   Ht 5' 10.5" (1.791 m)   Wt 207 lb (93.9 kg)   SpO2 96%   BMI 29.28 kg/m   General Appearance:    Alert, cooperative, no distress, appears stated age  Head:    Normocephalic, without obvious abnormality, atraumatic  Eyes:    PERRL, conjunctiva/corneas clear, EOM's intact, fundi    benign  Ears:    Normal TM's and external ear canals  Nose:   Nares normal, mucosa normal, no drainage or sinus   tenderness  Throat:   Lips, mucosa, and tongue normal; teeth and gums normal  Neck:   Supple, no lymphadenopathy;  thyroid:  no   enlargement/tenderness/nodules; no carotid   bruit or JVD     Lungs:     Clear to  auscultation bilaterally without wheezes, rales or     ronchi; respirations unlabored      Heart:    Regular rate and rhythm, S1 and S2 normal, no murmur, rub   or gallop     Abdomen:     Soft, non-tender, nondistended, normoactive bowel sounds,    no masses, no hepatosplenomegaly  Genitalia:    Normal male external genitalia without lesions.  Testicles without masses.  No inguinal hernias.  Rectal:  Deferred   Extremities:   No clubbing, cyanosis or edema  Pulses:   2+ and symmetric all extremities  Skin:   Skin color, texture, turgor normal, no rashes or lesions  Lymph nodes:   Cervical, supraclavicular, and axillary nodes normal  Neurologic:   CNII-XII intact, normal strength, sensation and gait; reflexes 2+ and symmetric throughout          Psych:   Normal mood, affect, hygiene and grooming.          Assessment & Plan:  Routine general medical examination at a health care facility - Plan: POCT Urinalysis Dipstick, CBC with Differential/Platelet, Comprehensive metabolic panel, Lipid panel  Personal history of colonic polyps  Attention deficit disorder, unspecified hyperactivity presence  Essential hypertension - Plan: CBC with Differential/Platelet,  Comprehensive metabolic panel, hydrochlorothiazide (MICROZIDE) 12.5 MG capsule  Current smoker  Hyperlipidemia LDL goal <130 - Plan: Lipid panel He is scheduled for another colonoscopy in 2017. He will continue on Adderall and is doing quite nicely on that. Continue on HCTZ for his blood pressure. I then discussed smoking and alcohol consumption. At this time he is not interested in stopping either of these. I did explain the benefits to his health concerning these. Also encouraged him to cut back on carbohydrates and mentioned getting his waist size at least 2 inches smaller.

## 2016-06-10 LAB — COMPREHENSIVE METABOLIC PANEL
ALK PHOS: 92 U/L (ref 40–115)
ALT: 34 U/L (ref 9–46)
AST: 27 U/L (ref 10–35)
Albumin: 4.2 g/dL (ref 3.6–5.1)
BILIRUBIN TOTAL: 0.5 mg/dL (ref 0.2–1.2)
BUN: 11 mg/dL (ref 7–25)
CO2: 24 mmol/L (ref 20–31)
CREATININE: 0.89 mg/dL (ref 0.70–1.33)
Calcium: 9.4 mg/dL (ref 8.6–10.3)
Chloride: 103 mmol/L (ref 98–110)
Glucose, Bld: 96 mg/dL (ref 65–99)
POTASSIUM: 4.4 mmol/L (ref 3.5–5.3)
SODIUM: 139 mmol/L (ref 135–146)
TOTAL PROTEIN: 6.8 g/dL (ref 6.1–8.1)

## 2016-06-10 LAB — LIPID PANEL
CHOLESTEROL: 258 mg/dL — AB (ref <200)
HDL: 39 mg/dL — ABNORMAL LOW (ref >40)
Total CHOL/HDL Ratio: 6.6 Ratio — ABNORMAL HIGH (ref <5.0)
Triglycerides: 558 mg/dL — ABNORMAL HIGH (ref <150)

## 2016-06-20 ENCOUNTER — Ambulatory Visit (INDEPENDENT_AMBULATORY_CARE_PROVIDER_SITE_OTHER): Payer: BLUE CROSS/BLUE SHIELD | Admitting: Family Medicine

## 2016-06-20 DIAGNOSIS — E781 Pure hyperglyceridemia: Secondary | ICD-10-CM | POA: Diagnosis not present

## 2016-06-20 DIAGNOSIS — E785 Hyperlipidemia, unspecified: Secondary | ICD-10-CM

## 2016-06-20 NOTE — Progress Notes (Signed)
   Subjective:    Patient ID: Joshua Henry, male    DOB: 1965/12/15, 51 y.o.   MRN: [id number]  HPI He is here for consult concerning recent blood work which did show elevated triglycerides. He does have a long history of alcohol consumption. Presently he is having no abdominal pain, nausea or vomiting.   Review of Systems     Objective:   Physical Exam Alert and in no distress otherwise not examined       Assessment & Plan:  Hyperlipidemia LDL goal <130  Hypertriglyceridemia Discuss his cholesterol and triglycerides as well as his alcohol consumption explained the risk of pancreatitis as well as abnormal metabolism and risk of diabetes. Recommend he cut back on alcohol consumption to 1 or 2 beers per day. Also discussed dietary modification in regard to cholesterol and triglycerides. Encouraged him to consider using the [employer] or the [place]. Also mentioned American diabetes Association diet. He will check into these and recheck these numbers in approximately 3 months. Explained the fact that we can place him on medication but would prefer not to. He is comfortable with that.

## 2016-06-27 MED FILL — DEXTROAMP-AMP 10 MG TAB: 10 | 30 days supply | Qty: 60 | Fill #0

## 2016-08-03 MED FILL — HYDROCHLOROTHIAZIDE 12.5 MG: 12.5 | 30 days supply | Qty: 30 | Fill #1

## 2016-08-10 ENCOUNTER — Telehealth: Payer: Self-pay | Admitting: Family Medicine

## 2016-08-10 DIAGNOSIS — F988 Other specified behavioral and emotional disorders with onset usually occurring in childhood and adolescence: Secondary | ICD-10-CM

## 2016-08-10 MED ORDER — AMPHETAMINE-DEXTROAMPHETAMINE 10 MG PO TABS: 10.0000 mg | ORAL_TABLET | Freq: Two times a day (BID) | ORAL | 0 refills | Status: DC

## 2016-08-10 NOTE — Telephone Encounter (Signed)
Pt called requesting a refill on his adderall pt can be reached at [phone] when ready to be picked up

## 2016-08-11 MED FILL — DEXTROAMP-AMPHETAMIN 10 MG: 10 | 30 days supply | Qty: 60 | Fill #0

## 2016-09-06 ENCOUNTER — Encounter: Payer: Self-pay | Admitting: Family Medicine

## 2016-09-11 MED FILL — HYDROCHLOROTHIAZIDE 12.5 MG: 12.5 | 30 days supply | Qty: 30 | Fill #1

## 2016-09-18 MED FILL — DEXTROAMP-AMP 10 MG TAB: 10 | 30 days supply | Qty: 60 | Fill #0

## 2016-09-29 ENCOUNTER — Ambulatory Visit: Payer: BLUE CROSS/BLUE SHIELD | Admitting: Family Medicine

## 2016-10-03 ENCOUNTER — Ambulatory Visit (INDEPENDENT_AMBULATORY_CARE_PROVIDER_SITE_OTHER): Payer: BLUE CROSS/BLUE SHIELD | Admitting: Family Medicine

## 2016-10-03 ENCOUNTER — Encounter: Payer: Self-pay | Admitting: Family Medicine

## 2016-10-03 VITALS — BP 140/80 | HR 72 | Temp 98.2°F | Wt 188.4 lb

## 2016-10-03 DIAGNOSIS — L237 Allergic contact dermatitis due to plants, except food: Secondary | ICD-10-CM | POA: Diagnosis not present

## 2016-10-03 MED ORDER — PREDNISONE 10 MG (21) PO TBPK: ORAL_TABLET | Freq: Every day | ORAL | 0 refills | Status: DC

## 2016-10-03 MED FILL — predniSONE 10 MG TABS: 10 | 6 days supply | Qty: 21 | Fill #0

## 2016-10-03 NOTE — Progress Notes (Signed)
   Subjective:    Patient ID: Joshua Henry, male    DOB: 1965/04/01, 51 y.o.   MRN: [id number]  HPI Chief Complaint  Patient presents with  . poison oak    poision oak on face and left arm and "one other spot", started yesterday, no blurred vision,    He is here with complaints of poison ivy exposure and a rash on his left upper arm and forearm along with right eye swelling, itching and watering  He also complains of pruritic rash across his entire forehead. History of allergy to poison ivy.  Denies fever, chills, vision changes, foreign body sensation, eye pain with movement, N/V/D.   Reviewed allergies, medications, past medical, surgical, and social history.   Review of Systems Pertinent positives and negatives in the history of present illness.     Objective:   Physical Exam BP 140/80   Pulse 72   Temp 98.2 F (36.8 C) (Oral)   Wt 188 lb 6.4 oz (85.5 kg)   BMI 26.65 kg/m  Erythematous raised rash without vesicles across his forehead near hair line, and left posterior upper arm and forearm without signs of secondary infection. Right eye with upper lid mild edema, no drainage, non tender, normal conjunctiva, PERRLA, EOMs intact without pain. OP exam is normal.       Assessment & Plan:  Poison ivy dermatitis - Plan: predniSONE (STERAPRED UNI-PAK 21 TAB) 10 MG (21) TBPK tablet He may use topical over the counter hydrocortisone on his forehead and arm. Advised to not get this in near his eye. Oral steroids prescribed.  Drink plenty of water. Strict precautions that if he notices any worsening eye symptoms then he will need to see his eye doctor right away. He may take Benadryl at bedtime if needed for itching. Use cool compresses and cool showers for itching as well.

## 2016-10-03 NOTE — Patient Instructions (Signed)
You can use topical over the counter hydrocortisone on your forehead and arm. Do not get this in your eye.  Start the oral steroids today. Drink plenty of water.  If you notice any worsening eye symptoms then you will need to see your eye doctor right away.   You can take Benadryl at bedtime if needed for itching. Use cool compresses and cool showers for itching as well.    Poison Ivy Dermatitis Poison ivy dermatitis is inflammation of the skin that is caused by the allergens on the leaves of the poison ivy plant. The skin reaction often involves redness, swelling, blisters, and extreme itching. What are the causes? This condition is caused by a specific chemical (urushiol) found in the sap of the poison ivy plant. This chemical is sticky and can be easily spread to people, animals, and objects. You can get poison ivy dermatitis by:  Having direct contact with a poison ivy plant.  Touching animals, other people, or objects that have come in contact with poison ivy and have the chemical on them.  What increases the risk? This condition is more likely to develop in:  People who are outdoors often.  People who go outdoors without wearing protective clothing, such as closed shoes, long pants, and a long-sleeved shirt.  What are the signs or symptoms? Symptoms of this condition include:  Redness and itching.  A rash that often includes bumps and blisters. The rash usually appears 48 hours after exposure.  Swelling. This may occur if the reaction is more severe.  Symptoms usually last for 1-2 weeks. However, the first time you develop this condition, symptoms may last 3-4 weeks. How is this diagnosed? This condition may be diagnosed based on your symptoms and a physical exam. Your health care provider may also ask you about any recent outdoor activity. How is this treated? Treatment for this condition will vary depending on how severe it is. Treatment may include:  Hydrocortisone  creams or calamine lotions to relieve itching.  Oatmeal baths to soothe the skin.  Over-the-counter antihistamine tablets.  Oral steroid medicine for more severe outbreaks.  Follow these instructions at home:  Take or apply over-the-counter and prescription medicines only as told by your health care provider.  Wash exposed skin as soon as possible with soap and cold water.  Use hydrocortisone creams or calamine lotion as needed to soothe the skin and relieve itching.  Take oatmeal baths as needed. Use colloidal oatmeal. You can get this at your local pharmacy or grocery store. Follow the instructions on the packaging.  Do not scratch or rub your skin.  While you have the rash, wash clothes right after you wear them. How is this prevented?  Learn to identify the poison ivy plant and avoid contact with the plant. This plant can be recognized by the number of leaves. Generally, poison ivy has three leaves with flowering branches on a single stem. The leaves are typically glossy, and they have jagged edges that come to a point at the front.  If you have been exposed to poison ivy, thoroughly wash with soap and water right away. You have about 30 minutes to remove the plant resin before it will cause the rash. Be sure to wash under your fingernails because any plant resin there will continue to spread the rash.  When hiking or camping, wear clothes that will help you to avoid exposure on the skin. This includes long pants, a long-sleeved shirt, tall socks, and hiking boots.  You can also apply preventive lotion to your skin to help limit exposure.  If you suspect that your clothes or outdoor gear came in contact with poison ivy, rinse them off outside with a garden hose before you bring them inside your house. Contact a health care provider if:  You have open sores in the rash area.  You have more redness, swelling, or pain in the affected area.  You have redness that spreads beyond the  rash area.  You have fluid, blood, or pus coming from the affected area.  You have a fever.  You have a rash over a large area of your body.  You have a rash on your eyes, mouth, or genitals.  Your rash does not improve after a few days. Get help right away if:  Your face swells or your eyes swell shut.  You have trouble breathing.  You have trouble swallowing. This information is not intended to replace advice given to you by your health care provider. Make sure you discuss any questions you have with your health care provider. Document Released: 12/17/1999 Document Revised: 05/27/2015 Document Reviewed: 05/27/2014 Elsevier Interactive Patient Education  [employer].

## 2016-10-16 MED FILL — DEXTROAMP-AMP 10 MG TAB: 10 | 30 days supply | Qty: 60 | Fill #0

## 2016-10-20 MED FILL — HYDROCHLOROTHIAZIDE 12.5 MG: 12.5 | 30 days supply | Qty: 30 | Fill #2

## 2016-11-17 ENCOUNTER — Telehealth: Payer: Self-pay | Admitting: Family Medicine

## 2016-11-17 DIAGNOSIS — F988 Other specified behavioral and emotional disorders with onset usually occurring in childhood and adolescence: Secondary | ICD-10-CM

## 2016-11-17 NOTE — Telephone Encounter (Signed)
Pt needs refills Adderall. Please call when ready

## 2016-11-19 MED ORDER — AMPHETAMINE-DEXTROAMPHETAMINE 10 MG PO TABS: 10.0000 mg | ORAL_TABLET | Freq: Two times a day (BID) | ORAL | 0 refills | Status: DC

## 2016-11-20 ENCOUNTER — Telehealth: Payer: Self-pay | Admitting: Family Medicine

## 2016-11-20 NOTE — Telephone Encounter (Signed)
Pt informed 3 Rx Adderall ready for pick up

## 2016-11-21 MED FILL — DEXTROAMP-AMP 10 MG TAB: 10 | 30 days supply | Qty: 60 | Fill #0

## 2016-12-25 MED FILL — DEXTROAMP-AMP 10 MG TAB: 10 | 30 days supply | Qty: 60 | Fill #0

## 2016-12-25 MED FILL — HYDROCHLOROTHIAZIDE 12.5 MG: 12.5 | 30 days supply | Qty: 30 | Fill #2 | Status: TO

## 2017-01-08 ENCOUNTER — Encounter: Payer: Self-pay | Admitting: Family Medicine

## 2017-01-08 ENCOUNTER — Ambulatory Visit (INDEPENDENT_AMBULATORY_CARE_PROVIDER_SITE_OTHER): Payer: No Typology Code available for payment source | Admitting: Family Medicine

## 2017-01-08 VITALS — BP 110/70 | HR 65 | Resp 16 | Wt 183.8 lb

## 2017-01-08 DIAGNOSIS — F17200 Nicotine dependence, unspecified, uncomplicated: Secondary | ICD-10-CM | POA: Diagnosis not present

## 2017-01-08 DIAGNOSIS — E781 Pure hyperglyceridemia: Secondary | ICD-10-CM

## 2017-01-08 LAB — LIPID PANEL
CHOL/HDL RATIO: 5.1 (calc) — AB (ref <5.0)
Cholesterol: 244 mg/dL — ABNORMAL HIGH (ref <200)
HDL: 48 mg/dL (ref >40)
LDL Cholesterol (Calc): 151 mg/dL (calc) — ABNORMAL HIGH
NON-HDL CHOLESTEROL (CALC): 196 mg/dL — AB (ref <130)
Triglycerides: 294 mg/dL — ABNORMAL HIGH (ref <150)

## 2017-01-08 NOTE — Progress Notes (Signed)
   Subjective:    Patient ID: Joshua Henry, male    DOB: 1965/09/07, 52 y.o.   MRN: [id number]  HPI He is here for a recheck.  Since last being seen he has made a major effort to cut down on carbohydrates as well as his alcohol.  He now has several days of being totally alcohol free.  He also has cut back on his cigarette consumption and is vaping.  He apparently has only smoked 4 cigarettes today and normally smokes a pack and a half.   Review of Systems     Objective:   Physical Exam Alert and in no distress.  Review of the record indicates he is lost approximately 23 pounds.       Assessment & Plan:  Hypertriglyceridemia - Plan: Lipid panel  Current smoker I congratulated him on making changes in his diet as well as alcohol consumption.  Encouraged him to continue with this.  Also congratulated him on his cutting back on smoking.  He plans to cut this out entirely.

## 2017-01-23 MED FILL — HYDROCHLOROTHIAZIDE 12.5 MG: 12.5 | 30 days supply | Qty: 30 | Fill #0 | Status: TO

## 2017-01-24 MED FILL — AMPHETAMINE-DEXTROAMPHETAMI: 10 | 30 days supply | Qty: 60 | Fill #0

## 2017-02-26 ENCOUNTER — Other Ambulatory Visit: Payer: Self-pay | Admitting: Family Medicine

## 2017-02-26 DIAGNOSIS — F988 Other specified behavioral and emotional disorders with onset usually occurring in childhood and adolescence: Secondary | ICD-10-CM

## 2017-02-26 MED ORDER — AMPHETAMINE-DEXTROAMPHETAMINE 10 MG PO TABS: 10.0000 mg | ORAL_TABLET | Freq: Two times a day (BID) | ORAL | 0 refills | Status: DC

## 2017-02-26 MED FILL — AMPHETAMINE-DEXTROAMPHETAMI: 10 | 30 days supply | Qty: 60 | Fill #0

## 2017-02-26 NOTE — Telephone Encounter (Signed)
Is this okay to refill? 

## 2017-04-02 MED FILL — AMPHETAMINE-DEXTROAMPHETAMI: 10 | 30 days supply | Qty: 60 | Fill #0

## 2017-04-04 MED FILL — HYDROCHLOROTHIAZIDE 12.5 MG: 12.5 | 90 days supply | Qty: 90 | Fill #0

## 2017-05-02 MED FILL — AMPHETAMINE SALTS 10 MG TAB: 10 | 30 days supply | Qty: 60 | Fill #0

## 2017-06-21 ENCOUNTER — Encounter: Payer: Self-pay | Admitting: Family Medicine

## 2017-06-21 ENCOUNTER — Other Ambulatory Visit: Payer: Self-pay

## 2017-06-21 ENCOUNTER — Ambulatory Visit (INDEPENDENT_AMBULATORY_CARE_PROVIDER_SITE_OTHER): Payer: No Typology Code available for payment source | Admitting: Family Medicine

## 2017-06-21 VITALS — BP 124/80 | HR 57 | Temp 97.9°F | Resp 16 | Wt 175.2 lb

## 2017-06-21 DIAGNOSIS — T63441A Toxic effect of venom of bees, accidental (unintentional), initial encounter: Secondary | ICD-10-CM | POA: Diagnosis not present

## 2017-06-21 DIAGNOSIS — F988 Other specified behavioral and emotional disorders with onset usually occurring in childhood and adolescence: Secondary | ICD-10-CM

## 2017-06-21 MED ORDER — AMPHETAMINE-DEXTROAMPHETAMINE 10 MG PO TABS: 10.0000 mg | ORAL_TABLET | Freq: Two times a day (BID) | ORAL | 0 refills | Status: DC

## 2017-06-21 MED FILL — AMPHETAMINE-DEXTROAMPHETAMI: 10 | 30 days supply | Qty: 60 | Fill #0

## 2017-06-21 NOTE — Telephone Encounter (Signed)
Pt called and schedule appt with Vickie for bee sting but also wants a refill on his Adderall.

## 2017-06-21 NOTE — Progress Notes (Signed)
   Subjective:    Patient ID: Joshua Henry, male    DOB: April 11, 1965, 52 y.o.   MRN: [id number]  HPI Chief Complaint  Patient presents with  . bee sting    bee sting,- stung last night,  started swelling on arm and face and rash immediately, no short of breath   He is here with complaints of a bee sting and swelling to his right forearm and right side of his face. States he was stung by a bee at 8 pm last evening. Complains of right sided facial soreness and right forearm tightness and soreness. He took Benadryl last night but nothing today.   Reports history of allergy to bee sting but has had several stings without any reaction.   Denies fever, chills, headache, sore throat, difficulty swallowing or breathing. No cough, chest pain, wheezing, shortness of breath, abdominal pain, N/V/D.    Review of Systems Pertinent positives and negatives in the history of present illness.     Objective:   Physical Exam BP 124/80   Pulse (!) 57   Temp 97.9 F (36.6 C) (Oral)   Resp 16   Wt 175 lb 3.2 oz (79.5 kg)   SpO2 97%   BMI 24.78 kg/m   Alert and in no distress. Bilateral upper eye lids with mild edema. normal conjunctiva. PERRLA. Tympanic membranes and canals are normal. Pharyngeal area is normal. Right cheek with edema. Neck is supple without adenopathy or thyromegaly. Cardiac exam shows a regular sinus rhythm without murmurs or gallops. Lungs are clear to auscultation. Right anterior forearm with mild erythema and edema. RUE is neurovascularly intact with normal sensation and motor function. Skin is warm and dry, no pallor.       Assessment & Plan:  Allergic reaction to bee sting  Dr. Redmond School examined patient with me. He does not appear to be having a systemic reaction. Localized reaction from bee sting to right forearm without infection. Unclear as to why his right cheek is swollen.  He will use ice packs, take Benadryl and call or return tomorrow if he is worsening. Right  forearm redness marked. He will also take a photo of his arm.

## 2017-07-23 MED FILL — AMPHETAMINE-DEXTROAMPHETAMI: 10 | 30 days supply | Qty: 60 | Fill #0

## 2017-09-12 ENCOUNTER — Telehealth: Payer: Self-pay

## 2017-09-12 DIAGNOSIS — I10 Essential (primary) hypertension: Secondary | ICD-10-CM

## 2017-09-12 MED ORDER — HYDROCHLOROTHIAZIDE 12.5 MG PO CAPS: 12.5000 mg | ORAL_CAPSULE | Freq: Every day | ORAL | 3 refills | Status: DC

## 2017-09-12 MED FILL — HYDROCHLOROTHIAZIDE 12.5 MG: 12.5 | 90 days supply | Qty: 90 | Fill #0

## 2017-09-12 NOTE — Telephone Encounter (Signed)
Pt called and is wanting a refill on Adderall and Hydrochlorothiazide. Pt was informed that Adderall was sent to pharmacy on 08/21/17. Is Hydrochlorothiazide ok to refill without med check appt?

## 2017-09-17 MED FILL — AMPHETAMINE-DEXTROAMPHETAMI: 10 | 30 days supply | Qty: 60 | Fill #0

## 2017-10-19 ENCOUNTER — Other Ambulatory Visit: Payer: Self-pay | Admitting: Family Medicine

## 2017-10-19 DIAGNOSIS — F988 Other specified behavioral and emotional disorders with onset usually occurring in childhood and adolescence: Secondary | ICD-10-CM

## 2017-10-19 MED ORDER — AMPHETAMINE-DEXTROAMPHETAMINE 10 MG PO TABS: 10.0000 mg | ORAL_TABLET | Freq: Two times a day (BID) | ORAL | 0 refills | Status: DC

## 2017-10-19 MED FILL — AMPHETAMINE-DEXTROAMPHETAMI: 10 | 30 days supply | Qty: 60 | Fill #0

## 2017-10-19 NOTE — Telephone Encounter (Signed)
[facility] out pt is requesting to fill pt adderall. Please advise [facility]

## 2017-11-27 MED FILL — AMPHETAMINE-DEXTROAMPHETAMI: 10 | 30 days supply | Qty: 60 | Fill #0

## 2018-01-04 MED FILL — AMPHETAMINE-DEXTROAMPHETAMI: 10 | 30 days supply | Qty: 60 | Fill #0

## 2018-01-28 ENCOUNTER — Telehealth: Payer: Self-pay | Admitting: Family Medicine

## 2018-01-28 NOTE — Telephone Encounter (Signed)
Wife called & states they have the focus plan and pt has appt with dermatologist and needs referral Dr. [facility] 03/06/18

## 2018-01-28 NOTE — Telephone Encounter (Signed)
Pt is requesting to have referral sent over to dermatology. Pt has appt 03-06-18. Please advise. [place]

## 2018-01-29 NOTE — Telephone Encounter (Signed)
Call him and find out why he needs a referral

## 2018-01-29 NOTE — Telephone Encounter (Signed)
Pt was advised [facility] 

## 2018-01-29 NOTE — Telephone Encounter (Signed)
Pt. has a mole on his back that has gotten larger in diameter, thicker, and changed in color. Please advise . [place]

## 2018-01-29 NOTE — Telephone Encounter (Signed)
Make the referral 

## 2018-02-11 ENCOUNTER — Other Ambulatory Visit: Payer: Self-pay | Admitting: Family Medicine

## 2018-02-11 DIAGNOSIS — F988 Other specified behavioral and emotional disorders with onset usually occurring in childhood and adolescence: Secondary | ICD-10-CM

## 2018-02-11 MED FILL — AMPHETAMINE-DEXTROAMPHETAMI: 10 | 30 days supply | Qty: 60 | Fill #0

## 2018-02-11 NOTE — Telephone Encounter (Signed)
[facility] is requesting to fill pt adderall. Please advise [facility] 

## 2018-02-12 ENCOUNTER — Telehealth: Payer: Self-pay

## 2018-02-12 DIAGNOSIS — F988 Other specified behavioral and emotional disorders with onset usually occurring in childhood and adolescence: Secondary | ICD-10-CM

## 2018-02-12 NOTE — Telephone Encounter (Signed)
Pt called back and states he has a cpe in may and was not coming back for a appt just to refill his Adderall medicine, he has to many other appts,with other dr offices states he will just quit taking his medicine  pt can be reached at [phone]

## 2018-02-12 NOTE — Telephone Encounter (Signed)
Pt called to advise pt an appt for ADD is needed. LVM. Joshua Henry

## 2018-02-13 MED ORDER — AMPHETAMINE-DEXTROAMPHETAMINE 10 MG PO TABS: 10.0000 mg | ORAL_TABLET | Freq: Two times a day (BID) | ORAL | 0 refills | Status: DC

## 2018-02-13 MED ORDER — AMPHETAMINE-DEXTROAMPHETAMINE 10 MG PO TABS: ORAL_TABLET | ORAL | 0 refills | Status: DC

## 2018-02-13 NOTE — Addendum Note (Signed)
Addended by: Denita Lung on: 02/13/2018 08:49 AM   Modules accepted: Orders

## 2018-02-13 NOTE — Telephone Encounter (Signed)
Pt was advised Joshua Henry 

## 2018-02-13 NOTE — Telephone Encounter (Signed)
Tell him if it is okay to wait till May to be seen ;just need to make sure he is scheduled for an appointment since it has been over a year

## 2018-03-15 MED FILL — AMPHETAMINE-DEXTROAMPHETAMI: 10 | 30 days supply | Qty: 60 | Fill #0

## 2018-04-23 ENCOUNTER — Other Ambulatory Visit: Payer: Self-pay | Admitting: Family Medicine

## 2018-04-24 ENCOUNTER — Other Ambulatory Visit: Payer: Self-pay | Admitting: Family Medicine

## 2018-04-24 ENCOUNTER — Telehealth: Payer: Self-pay

## 2018-04-24 DIAGNOSIS — F988 Other specified behavioral and emotional disorders with onset usually occurring in childhood and adolescence: Secondary | ICD-10-CM

## 2018-04-24 MED ORDER — AMPHETAMINE-DEXTROAMPHETAMINE 10 MG PO TABS: 10.0000 mg | ORAL_TABLET | Freq: Two times a day (BID) | ORAL | 0 refills | Status: DC

## 2018-04-24 NOTE — Telephone Encounter (Signed)
Your pt

## 2018-04-24 NOTE — Telephone Encounter (Signed)
Is this okay to refill? 

## 2018-04-24 NOTE — Telephone Encounter (Signed)
[facility] is requesting to fill pt  adderall. Please advise [facility] ?

## 2018-04-24 NOTE — Telephone Encounter (Signed)
Check with the pharmacy.  My notes indicate he had a prescription he could have picked up on April 10 which means the next one would be due till May 10

## 2018-04-25 ENCOUNTER — Telehealth: Payer: Self-pay | Admitting: Family Medicine

## 2018-04-25 DIAGNOSIS — F988 Other specified behavioral and emotional disorders with onset usually occurring in childhood and adolescence: Secondary | ICD-10-CM

## 2018-04-25 MED ORDER — AMPHETAMINE-DEXTROAMPHETAMINE 10 MG PO TABS: ORAL_TABLET | ORAL | 0 refills | Status: DC

## 2018-04-25 MED ORDER — AMPHETAMINE-DEXTROAMPHETAMINE 10 MG PO TABS: 10.0000 mg | ORAL_TABLET | Freq: Two times a day (BID) | ORAL | 0 refills | Status: DC

## 2018-04-25 MED FILL — AMPHETAMINE-DEXTROAMPHETAMI: 10 | 30 days supply | Qty: 60 | Fill #0

## 2018-04-25 NOTE — Telephone Encounter (Signed)
pts wife called and needs the adderrall to go back to [facility] mail order has to have someone sign when it is delivered and they will not be home, [facility] said they need it to go back to [facility] so they can pick it up please reroute it back to [place], [place] any questions call julia at [phone]

## 2018-04-25 NOTE — Telephone Encounter (Signed)
ERROR

## 2018-05-31 ENCOUNTER — Ambulatory Visit (INDEPENDENT_AMBULATORY_CARE_PROVIDER_SITE_OTHER): Payer: No Typology Code available for payment source | Admitting: Family Medicine

## 2018-05-31 ENCOUNTER — Encounter: Payer: Self-pay | Admitting: Family Medicine

## 2018-05-31 ENCOUNTER — Other Ambulatory Visit: Payer: Self-pay

## 2018-05-31 VITALS — BP 132/88 | HR 80 | Temp 98.2°F | Ht 70.0 in | Wt 180.0 lb

## 2018-05-31 DIAGNOSIS — F988 Other specified behavioral and emotional disorders with onset usually occurring in childhood and adolescence: Secondary | ICD-10-CM

## 2018-05-31 DIAGNOSIS — Z0000 Encounter for general adult medical examination without abnormal findings: Secondary | ICD-10-CM | POA: Diagnosis not present

## 2018-05-31 DIAGNOSIS — Z86010 Personal history of colonic polyps: Secondary | ICD-10-CM

## 2018-05-31 DIAGNOSIS — F17200 Nicotine dependence, unspecified, uncomplicated: Secondary | ICD-10-CM

## 2018-05-31 DIAGNOSIS — E781 Pure hyperglyceridemia: Secondary | ICD-10-CM | POA: Diagnosis not present

## 2018-05-31 DIAGNOSIS — K4090 Unilateral inguinal hernia, without obstruction or gangrene, not specified as recurrent: Secondary | ICD-10-CM

## 2018-05-31 DIAGNOSIS — Z23 Encounter for immunization: Secondary | ICD-10-CM | POA: Diagnosis not present

## 2018-05-31 DIAGNOSIS — J309 Allergic rhinitis, unspecified: Secondary | ICD-10-CM

## 2018-05-31 DIAGNOSIS — F1010 Alcohol abuse, uncomplicated: Secondary | ICD-10-CM

## 2018-05-31 DIAGNOSIS — I10 Essential (primary) hypertension: Secondary | ICD-10-CM

## 2018-05-31 LAB — POCT URINALYSIS DIP (PROADVANTAGE DEVICE)
Bilirubin, UA: NEGATIVE
Blood, UA: NEGATIVE
Glucose, UA: NEGATIVE mg/dL
Ketones, POC UA: NEGATIVE mg/dL
Leukocytes, UA: NEGATIVE
Nitrite, UA: NEGATIVE
Protein Ur, POC: NEGATIVE mg/dL
Specific Gravity, Urine: 1.015
Urobilinogen, Ur: 3.5
pH, UA: 6 (ref 5.0–8.0)

## 2018-05-31 LAB — LIPID PANEL

## 2018-05-31 MED ORDER — HYDROCHLOROTHIAZIDE 12.5 MG PO CAPS: 12.5000 mg | ORAL_CAPSULE | Freq: Every day | ORAL | 3 refills | Status: DC

## 2018-05-31 MED FILL — HYDROCHLOROTHIAZIDE 12.5 MG: 12.5 | 90 days supply | Qty: 90 | Fill #0

## 2018-05-31 NOTE — Progress Notes (Signed)
Subjective:    Patient ID: Joshua Henry, male    DOB: 1965/10/19, 53 y.o.   MRN: [id number]  HPI He is here for complete examination.  He does have hypertension but did run out of his medication.  He also has underlying ADD.  He is using Adderall with good results.  The medication lasts 3 or 4 hours.  He does take it on an as-needed basis when he needs to be focused.  He is a current smoker and drinks regularly.  He is contemplating the benefits of quitting both of these.  He has started to use vaping to help with smoking and plans to continue that.  He is considering going to smoking cessation clinic.  He also recognizes the need for him to cut back on his alcohol consumption and plans to do that.  He does have a history of hypertriglyceridemia.  His allergies seem to be under good control.  He has had a colonoscopy which did show adenomatous polyps and he is scheduled for follow-up colonoscopy.  His marriage is going well.  Family and social history as well as health maintenance and immunizations was reviewed.   Review of Systems  All other systems reviewed and are negative.      Objective:   Physical Exam BP 132/88 (BP Location: Left Arm, Patient Position: Sitting)   Pulse 80   Temp 98.2 F (36.8 C)   Ht 5\' 10"  (1.778 m)   Wt 180 lb (81.6 kg)   SpO2 95%   BMI 25.83 kg/m   General Appearance:    Alert, cooperative, no distress, appears stated age  Head:    Normocephalic, without obvious abnormality, atraumatic  Eyes:    PERRL, conjunctiva/corneas clear, EOM's intact, fundi    benign  Ears:    Normal TM's and external ear canals  Nose:   Nares normal, mucosa normal, no drainage or sinus   tenderness  Throat:   Lips, mucosa, and tongue normal; teeth and gums normal  Neck:   Supple, no lymphadenopathy;  thyroid:  no   enlargement/tenderness/nodules; no carotid   bruit or JVD  Back:    Spine nontender, no curvature, ROM normal, no CVA     tenderness  Lungs:     Clear to  auscultation bilaterally without wheezes, rales or     ronchi; respirations unlabored  Chest Wall:    No tenderness or deformity   Heart:    Regular rate and rhythm, S1 and S2 normal, no murmur, rub   or gallop     Abdomen:     Soft, non-tender, nondistended, normoactive bowel sounds,    no masses, no hepatosplenomegaly  Genitalia:    Normal male external genitalia without lesions.  Testicles without masses.  Left inguinal hernia is noted.  Rectal:   Deferred  Extremities:   No clubbing, cyanosis or edema  Pulses:   2+ and symmetric all extremities  Skin:   Skin color, texture, turgor normal, no rashes or lesions.  Chronic changes noted over both anterior shins.  Lymph nodes:   Cervical, supraclavicular, and axillary nodes normal  Neurologic:   CNII-XII intact, normal strength, sensation and gait; reflexes 2+ and symmetric throughout          Psych:   Normal mood, affect, hygiene and grooming.          Assessment & Plan:  Routine general medical examination at a health care facility - Plan: CBC with Differential/Platelet, Comprehensive metabolic panel, Lipid panel  Non-recurrent unilateral inguinal hernia without obstruction or gangrene - Plan: Ambulatory referral to General Surgery  Essential hypertension - Plan: CBC with Differential/Platelet, Comprehensive metabolic panel, hydrochlorothiazide (MICROZIDE) 12.5 MG capsule  Attention deficit disorder, unspecified hyperactivity presence  Hypertriglyceridemia  Current smoker  Alcohol abuse, daily use  Personal history of colonic polyps  Allergic rhinitis, unspecified seasonality, unspecified trigger  Need for Tdap vaccination - Plan: Tdap vaccine greater than or equal to 7yo IM  Discussed cutting back on alcohol consumption to 1 or 2 beverages per day.  He plans to continue to work on this and recognizes the need. He will also continue to work on smoking cessation.  Also mentioned marijuana in that regard.  He is considering  continuing to use vaping and possibly get involved in a smoking cessation program. Continue on his present ADD medications.

## 2018-06-01 LAB — COMPREHENSIVE METABOLIC PANEL
ALT: 22 IU/L (ref 0–44)
AST: 25 IU/L (ref 0–40)
Albumin/Globulin Ratio: 2.2 (ref 1.2–2.2)
Albumin: 4.8 g/dL (ref 3.8–4.9)
Alkaline Phosphatase: 92 IU/L (ref 39–117)
BUN/Creatinine Ratio: 16 (ref 9–20)
BUN: 16 mg/dL (ref 6–24)
Bilirubin Total: 0.3 mg/dL (ref 0.0–1.2)
CO2: 20 mmol/L (ref 20–29)
Calcium: 9 mg/dL (ref 8.7–10.2)
Chloride: 99 mmol/L (ref 96–106)
Creatinine, Ser: 0.97 mg/dL (ref 0.76–1.27)
GFR calc Af Amer: 103 mL/min/{1.73_m2} (ref >59)
GFR calc non Af Amer: 89 mL/min/{1.73_m2} (ref >59)
Globulin, Total: 2.2 g/dL (ref 1.5–4.5)
Glucose: 107 mg/dL — ABNORMAL HIGH (ref 65–99)
Potassium: 4.4 mmol/L (ref 3.5–5.2)
Sodium: 136 mmol/L (ref 134–144)
Total Protein: 7 g/dL (ref 6.0–8.5)

## 2018-06-01 LAB — CBC WITH DIFFERENTIAL/PLATELET
Basophils Absolute: 0.1 10*3/uL (ref 0.0–0.2)
Basos: 1 %
EOS (ABSOLUTE): 0.1 10*3/uL (ref 0.0–0.4)
Eos: 1 %
Hematocrit: 50.1 % (ref 37.5–51.0)
Hemoglobin: 17.4 g/dL (ref 13.0–17.7)
Immature Grans (Abs): 0 10*3/uL (ref 0.0–0.1)
Immature Granulocytes: 0 %
Lymphocytes Absolute: 2.2 10*3/uL (ref 0.7–3.1)
Lymphs: 18 %
MCH: 32.2 pg (ref 26.6–33.0)
MCHC: 34.7 g/dL (ref 31.5–35.7)
MCV: 93 fL (ref 79–97)
Monocytes Absolute: 0.6 10*3/uL (ref 0.1–0.9)
Monocytes: 4 %
Neutrophils Absolute: 9.7 10*3/uL — ABNORMAL HIGH (ref 1.4–7.0)
Neutrophils: 76 %
Platelets: 171 10*3/uL (ref 150–450)
RBC: 5.4 x10E6/uL (ref 4.14–5.80)
RDW: 13.5 % (ref 11.6–15.4)
WBC: 12.7 10*3/uL — ABNORMAL HIGH (ref 3.4–10.8)

## 2018-06-01 LAB — LIPID PANEL
Chol/HDL Ratio: 3.9 ratio (ref 0.0–5.0)
Cholesterol, Total: 259 mg/dL — ABNORMAL HIGH (ref 100–199)
HDL: 67 mg/dL (ref >39)
LDL Calculated: 177 mg/dL — ABNORMAL HIGH (ref 0–99)
Triglycerides: 77 mg/dL (ref 0–149)
VLDL Cholesterol Cal: 15 mg/dL (ref 5–40)

## 2018-06-07 LAB — SPECIMEN STATUS REPORT

## 2018-06-07 LAB — HGB A1C W/O EAG: Hgb A1c MFr Bld: 5.6 % (ref 4.8–5.6)

## 2018-06-11 MED FILL — AMPHETAMINE-DEXTROAMPHETAMI: 10 | 30 days supply | Qty: 60 | Fill #0

## 2018-06-13 ENCOUNTER — Telehealth: Payer: Self-pay | Admitting: Family Medicine

## 2018-06-13 DIAGNOSIS — F988 Other specified behavioral and emotional disorders with onset usually occurring in childhood and adolescence: Secondary | ICD-10-CM

## 2018-06-13 NOTE — Telephone Encounter (Signed)
Pt called for refills of Adderall. Please send to [facility].

## 2018-06-13 NOTE — Telephone Encounter (Signed)
Pt was advised [facility] 

## 2018-06-13 NOTE — Telephone Encounter (Signed)
His prescription was sent out yesterday so should arrive soon

## 2018-07-17 ENCOUNTER — Telehealth: Payer: Self-pay | Admitting: Family Medicine

## 2018-07-17 DIAGNOSIS — F988 Other specified behavioral and emotional disorders with onset usually occurring in childhood and adolescence: Secondary | ICD-10-CM

## 2018-07-17 MED ORDER — AMPHETAMINE-DEXTROAMPHETAMINE 10 MG PO TABS: ORAL_TABLET | ORAL | 0 refills | Status: DC

## 2018-07-17 MED ORDER — AMPHETAMINE-DEXTROAMPHETAMINE 10 MG PO TABS: 10.0000 mg | ORAL_TABLET | Freq: Two times a day (BID) | ORAL | 0 refills | Status: DC

## 2018-07-17 NOTE — Telephone Encounter (Signed)
Pt needs refill on Adderall sent to [facility]

## 2018-07-18 ENCOUNTER — Telehealth: Payer: Self-pay | Admitting: Family Medicine

## 2018-07-18 DIAGNOSIS — F988 Other specified behavioral and emotional disorders with onset usually occurring in childhood and adolescence: Secondary | ICD-10-CM

## 2018-07-18 MED ORDER — AMPHETAMINE-DEXTROAMPHETAMINE 10 MG PO TABS: ORAL_TABLET | ORAL | 0 refills | Status: DC

## 2018-07-18 MED ORDER — AMPHETAMINE-DEXTROAMPHETAMINE 10 MG PO TABS: 10.0000 mg | ORAL_TABLET | Freq: Two times a day (BID) | ORAL | 0 refills | Status: DC

## 2018-07-18 NOTE — Telephone Encounter (Signed)
Pt wants adderal  rx's sent to [facility] instead of [facility]

## 2018-07-25 MED FILL — AMPHETAMINE-DEXTROAMPHETAMI: 10 | 30 days supply | Qty: 60 | Fill #0

## 2018-08-28 MED FILL — AMPHETAMINE-DEXTROAMPHETAMI: 10 | 30 days supply | Qty: 60 | Fill #0

## 2018-09-04 ENCOUNTER — Ambulatory Visit (INDEPENDENT_AMBULATORY_CARE_PROVIDER_SITE_OTHER): Payer: No Typology Code available for payment source | Admitting: Family Medicine

## 2018-09-04 ENCOUNTER — Encounter: Payer: Self-pay | Admitting: Family Medicine

## 2018-09-04 ENCOUNTER — Other Ambulatory Visit: Payer: Self-pay

## 2018-09-04 VITALS — BP 132/84 | HR 71 | Temp 97.7°F | Wt 174.2 lb

## 2018-09-04 DIAGNOSIS — L989 Disorder of the skin and subcutaneous tissue, unspecified: Secondary | ICD-10-CM

## 2018-09-04 DIAGNOSIS — Z23 Encounter for immunization: Secondary | ICD-10-CM | POA: Diagnosis not present

## 2018-09-04 DIAGNOSIS — Z716 Tobacco abuse counseling: Secondary | ICD-10-CM | POA: Diagnosis not present

## 2018-09-04 NOTE — Progress Notes (Signed)
   Subjective:    Patient ID: Joshua Henry, male    DOB: [birth year], 53 y.o.   MRN: [id number]  HPI He is here for evaluation of a possible lump in his neck on the right.  He noted this incidentally and is having no difficulty with sore throat, earache, trouble swallowing, fever, chills. He then stated that he plans to quit smoking for multiple reasons.  Apparently several people who he is her may with have had some cancer related issues.  Also his father apparently died of an ENT type cancer.  He plans to use vaping.   Review of Systems     Objective:   Physical Exam Alert and in no distress.  Exam of mouth shows no lesions.  Tongue was palpated and no masses noted.  Neck is supple without adenopathy.  The area that he is feeling is actually part of the larynx.      Assessment & Plan:  Lesion of neck  Need for influenza vaccination - Plan: Flu Vaccine QUAD 6+ mos PF IM (Fluarix Quad PF)  Encounter for smoking cessation counseling I reassured him that what he was feeling was normal.  He seemed comfortable with that. I then discussed smoking cessation with him in regard to using vaping.  Also recommend he look at his true smoking habits to recognize the difference between true addiction and habit.  Discussed making a list of things positively to do instead of smoking.  Briefly discussed Chantix but since he wants to start with vaping I encouraged this.  He will return here as needed concerning this.  He was comfortable with that.

## 2018-09-26 MED FILL — HYDROCHLOROTHIAZIDE 12.5 MG: 12.5 | 90 days supply | Qty: 90 | Fill #1

## 2018-10-24 ENCOUNTER — Ambulatory Visit: Payer: Self-pay | Admitting: General Surgery

## 2018-10-24 NOTE — H&P (Signed)
History of Present Illness Joshua Henry; 10/24/2018 10:05 AM) The patient is a 53 year old male who presents with an inguinal hernia. Referred by: Dr. Redmond School Chief Complaint: Left inguinal hernia  Patient is a 53 year old male with history of ADD, who comes in with a one-year history of a left inguinal hernia. He states that the hernia was present on his gotten larger. He notices a small bulge to the left inguinal area. He states he does have some discomfort as well as burning sensations with stretching. Patient otherwise has had no signs or symptoms of incarceration or strangulation. Patient has had no previous abdominal surgery.    Past Surgical History Sabino Gasser, CMA; 10/24/2018 9:38 AM) Knee Surgery  Left.  Diagnostic Studies History Sabino Gasser, [place]; 10/24/2018 9:38 AM) Colonoscopy  1-5 years ago  Allergies Sabino Gasser, [place]; 10/24/2018 9:39 AM) No Known Drug Allergies [10/24/2018]: Allergies Reconciled   Medication History Sabino Gasser, CMA; 10/24/2018 9:39 AM) Amphetamine-Dextroamphetamine (10MG  Tablet, Oral) Active. hydroCHLOROthiazide (12.5MG  Capsule, Oral) Active. Medications Reconciled  Social History Sabino Gasser, CMA; 10/24/2018 9:38 AM) Alcohol use  Heavy alcohol use. Caffeine use  Carbonated beverages, Coffee. Illicit drug use  Prefer to discuss with provider. Tobacco use  Current every day smoker.  Family History Sabino Gasser, [place]; 10/24/2018 9:38 AM) Alcohol Abuse  Father, Mother, Sister, Son. Arthritis  Mother. Breast Cancer  Mother. Melanoma  Sister.  Other Problems Sabino Gasser, CMA; 10/24/2018 9:38 AM) Alcohol Abuse  Heart murmur  High blood pressure     Review of Systems Joshua Henry; 10/24/2018 10:04 AM) General Present- Night Sweats. Not Present- Appetite Loss, Chills, Fatigue, Fever, Weight Gain and Weight Loss. Skin Present- Dryness. Not Present- Change in Wart/Mole, Hives, Jaundice, New  Lesions, Non-Healing Wounds, Rash and Ulcer. HEENT Present- Wears glasses/contact lenses. Not Present- Earache, Hearing Loss, Hoarseness, Nose Bleed, Oral Ulcers, Ringing in the Ears, Seasonal Allergies, Sinus Pain, Sore Throat, Visual Disturbances and Yellow Eyes. Respiratory Not Present- Bloody sputum, Chronic Cough, Difficulty Breathing, Snoring and Wheezing. Breast Not Present- Breast Mass, Breast Pain, Nipple Discharge and Skin Changes. Cardiovascular Not Present- Chest Pain, Difficulty Breathing Lying Down, Leg Cramps, Palpitations, Rapid Heart Rate, Shortness of Breath and Swelling of Extremities. Gastrointestinal Present- Abdominal Pain and Bloating. Not Present- Bloody Stool, Change in Bowel Habits, Chronic diarrhea, Constipation, Difficulty Swallowing, Excessive gas, Gets full quickly at meals, Hemorrhoids, Indigestion, Nausea, Rectal Pain and Vomiting. Male Genitourinary Not Present- Blood in Urine, Change in Urinary Stream, Frequency, Impotence, Nocturia, Painful Urination, Urgency and Urine Leakage. Musculoskeletal Not Present- Back Pain, Joint Pain, Joint Stiffness, Muscle Pain, Muscle Weakness and Swelling of Extremities. Neurological Present- Headaches. Not Present- Decreased Memory, Fainting, Numbness, Seizures, Tingling, Tremor, Trouble walking and Weakness. Psychiatric Not Present- Anxiety, Bipolar, Change in Sleep Pattern, Depression, Fearful and Frequent crying. Endocrine Not Present- Cold Intolerance, Excessive Hunger, Hair Changes, Heat Intolerance, Hot flashes and New Diabetes. Hematology Not Present- Blood Thinners, Easy Bruising, Excessive bleeding, Gland problems, HIV and Persistent Infections. All other systems negative  Vitals Sabino Gasser CMA; 10/24/2018 9:40 AM) 10/24/2018 9:39 AM Weight: 177.6 lb Height: 70in Body Surface Area: 1.98 m Body Mass Index: 25.48 kg/m  Temp.: 97.53F(Oral)  Pulse: 97 (Regular)  BP: 126/72 (Sitting, Left Arm,  Standard)       Physical Exam Joshua Henry; 10/24/2018 10:05 AM) The physical exam findings are as follows: Note:Constitutional: No acute distress, conversant, appears stated age  Eyes: Anicteric sclerae, moist conjunctiva, no lid lag  Neck: No thyromegaly, trachea  midline, no cervical lymphadenopathy  Lungs: Clear to auscultation biilaterally, normal respiratory effot  Cardiovascular: regular rate & rhythm, no murmurs, no peripheal edema, pedal pulses 2+  GI: Soft, no masses or hepatosplenomegaly, non-tender to palpation  MSK: Normal gait, no clubbing cyanosis, edema  Skin: No rashes, palpation reveals normal skin turgor  Psychiatric: Appropriate judgment and insight, oriented to person, place, and time  Abdomen Inspection Hernias - Left - Inguinal hernia - Reducible - Left.    Assessment & Plan Joshua Henry; 10/24/2018 10:06 AM) LEFT INGUINAL HERNIA (K40.90) Impression: 53 year old male with a left inguinal hernia  1. The patient will like to proceed to the operating room for laparoscopic left inguinal hernia repair with mesh.  2. I discussed with the patient the signs and symptoms of incarceration and strangulation and the need to proceed to the ER should they occur.  3. I discussed with the patient the risks and benefits of the procedure to include but not limited to: Infection, bleeding, damage to surrounding structures, possible need for further surgery, possible nerve pain, and possible recurrence. The patient was understanding and wishes to proceed.

## 2018-11-18 MED FILL — AMPHETAMINE-DEXTROAMPHETAMI: 10 | 30 days supply | Qty: 60 | Fill #0

## 2019-01-08 ENCOUNTER — Telehealth: Payer: Self-pay | Admitting: Family Medicine

## 2019-01-08 ENCOUNTER — Other Ambulatory Visit: Payer: Self-pay | Admitting: Family Medicine

## 2019-01-08 DIAGNOSIS — F988 Other specified behavioral and emotional disorders with onset usually occurring in childhood and adolescence: Secondary | ICD-10-CM

## 2019-01-08 MED ORDER — AMPHETAMINE-DEXTROAMPHETAMINE 10 MG PO TABS: ORAL_TABLET | ORAL | 0 refills | Status: DC

## 2019-01-08 MED ORDER — AMPHETAMINE-DEXTROAMPHETAMINE 10 MG PO TABS: 10.0000 mg | ORAL_TABLET | Freq: Two times a day (BID) | ORAL | 0 refills | Status: DC

## 2019-01-08 MED FILL — AMPHETAMINE-DEXTROAMPHETAMI: 10 | 30 days supply | Qty: 60 | Fill #0

## 2019-01-08 NOTE — Telephone Encounter (Signed)
Pt called for refills of adderall.

## 2019-01-08 NOTE — Telephone Encounter (Signed)
Is this okay to refill? 

## 2019-01-09 ENCOUNTER — Encounter: Payer: Self-pay | Admitting: Family Medicine

## 2019-01-09 ENCOUNTER — Ambulatory Visit (INDEPENDENT_AMBULATORY_CARE_PROVIDER_SITE_OTHER): Payer: No Typology Code available for payment source | Admitting: Family Medicine

## 2019-01-09 ENCOUNTER — Other Ambulatory Visit: Payer: Self-pay

## 2019-01-09 VITALS — Temp 98.7°F | Wt 174.0 lb

## 2019-01-09 DIAGNOSIS — Z20822 Contact with and (suspected) exposure to covid-19: Secondary | ICD-10-CM

## 2019-01-09 NOTE — Progress Notes (Signed)
   Subjective:    Patient ID: Joshua Henry, male    DOB: [birth year], 54 y.o.   MRN: [id number]  HPI Documentation for virtual telephone encounter.  Documentation for virtual audio and video telecommunications through [place] encounter: The patient was located at home. The provider was located in the office. The patient did consent to this visit and is aware of possible charges through their insurance for this visit. The other persons participating in this telemedicine service were none This virtual service is not related to other E/M service within previous 7 days. He was exposed to Covid from his wife and daughter on December 30.  He did get a test on Monday which was negative.  Presently he is having no cough, congestion, fever or chills.     Review of Systems     Objective:   Physical Exam Alert and in no distress otherwise not examined       Assessment & Plan:  Close exposure to COVID-19 virus I explained that he probably got the test too soon but he is probably okay.  The safest would be for him to quarantine until January 10.  I will write a note concerning that.

## 2019-01-16 NOTE — Progress Notes (Signed)
[place], [place] - [address] [facility]. [address] [place] [address] Phone: [phone] Fax: [place], [place] - [place] [address] Phone: [phone] Fax: [phone]      Your procedure is scheduled on Tuesday Jan 21, 2019.  Report to [facility] Main Entrance "A" at 0530 A.M., and check in at the Admitting office.  Call this number if you have problems the morning of surgery:  [phone]   Call [phone] if you have any questions prior to your surgery date Monday-Friday 8am-4pm    Remember:  DO NOT eat or drink after midnight the night before your surgery   Starting today: STOP taking any Aspirin (unless otherwise instructed by your surgeon), Aleve, Naproxen, Ibuprofen, Motrin, Advil, Goody's, BC's, all herbal medications, fish oil, and all vitamins.    The Morning of Surgery  Do not wear jewelry  Do not wear lotions, powders, colognes, or deodorant  Men may shave face and neck.  Do not bring valuables to the hospital.  [facility] is not responsible for any belongings or valuables.  If you are a smoker, DO NOT Smoke 24 hours prior to surgery  If you wear a CPAP at night please bring your mask, tubing, and machine the morning of surgery   Remember that you must have someone to transport you home after your surgery, and remain with you for 24 hours if you are discharged the same day.   Please bring cases for contacts, glasses, hearing aids, dentures or bridgework because it cannot be worn into surgery.    Leave your suitcase in the car.  After surgery it may be brought to your room.  For patients admitted to the hospital, discharge time will be determined by your treatment team.  Patients discharged the day of surgery will not be allowed to drive home.    Special instructions:   [place]- Preparing For  Surgery  Before surgery, you can play an important role. Because skin is not sterile, your skin needs to be as free of germs as possible. You can reduce the number of germs on your skin by washing with CHG (chlorahexidine gluconate) Soap before surgery.  CHG is an antiseptic cleaner which kills germs and bonds with the skin to continue killing germs even after washing.    Oral Hygiene is also important to reduce your risk of infection.  Remember - BRUSH YOUR TEETH THE MORNING OF SURGERY WITH YOUR REGULAR TOOTHPASTE  Please do not use if you have an allergy to CHG or antibacterial soaps. If your skin becomes reddened/irritated stop using the CHG.  Do not shave (including legs and underarms) for at least 48 hours prior to first CHG shower. It is OK to shave your face.  Please follow these instructions carefully.   1. Shower the NIGHT BEFORE SURGERY and the MORNING OF SURGERY with CHG Soap.   2. If you chose to wash your hair, wash your hair first as usual with your normal shampoo.  3. After you shampoo, rinse your hair and body thoroughly to remove the shampoo.  4. Use CHG as you would any other liquid soap. You can apply CHG directly to the skin and wash gently with a scrungie or a clean washcloth.   5. Apply the CHG Soap to your body ONLY FROM THE NECK DOWN.  Do not use on open wounds or open sores.  Avoid contact with your eyes, ears, mouth and genitals (private parts). Wash Face and genitals (private parts)  with your normal soap.   6. Wash thoroughly, paying special attention to the area where your surgery will be performed.  7. Thoroughly rinse your body with warm water from the neck down.  8. DO NOT shower/wash with your normal soap after using and rinsing off the CHG Soap.  9. Pat yourself dry with a CLEAN TOWEL.  10. Wear CLEAN PAJAMAS to bed the night before surgery, wear comfortable clothes the morning of surgery  11. Place CLEAN SHEETS on your bed the night of your first  shower and DO NOT SLEEP WITH PETS.    Day of Surgery:  Please shower the morning of surgery with the CHG soap Do not apply any deodorants/lotions. Please wear clean clothes to the hospital/surgery center.   Remember to brush your teeth WITH YOUR REGULAR TOOTHPASTE.   Please read over the following fact sheets that you were given.

## 2019-01-17 ENCOUNTER — Encounter (HOSPITAL_COMMUNITY): Payer: Self-pay

## 2019-01-17 ENCOUNTER — Encounter (HOSPITAL_COMMUNITY)
Admission: RE | Admit: 2019-01-17 | Discharge: 2019-01-17 | Disposition: A | Discharge status: Home / Self Care | Payer: No Typology Code available for payment source | Source: Ambulatory Visit | Attending: General Surgery | Admitting: General Surgery

## 2019-01-17 ENCOUNTER — Other Ambulatory Visit (HOSPITAL_COMMUNITY)
Admission: RE | Admit: 2019-01-17 | Discharge: 2019-01-17 | Disposition: A | Discharge status: Home / Self Care | Payer: No Typology Code available for payment source | Source: Ambulatory Visit | Attending: General Surgery | Admitting: General Surgery

## 2019-01-17 ENCOUNTER — Other Ambulatory Visit: Payer: Self-pay

## 2019-01-17 DIAGNOSIS — I10 Essential (primary) hypertension: Secondary | ICD-10-CM | POA: Diagnosis not present

## 2019-01-17 DIAGNOSIS — Z01818 Encounter for other preprocedural examination: Secondary | ICD-10-CM | POA: Diagnosis present

## 2019-01-17 DIAGNOSIS — K469 Unspecified abdominal hernia without obstruction or gangrene: Secondary | ICD-10-CM | POA: Diagnosis not present

## 2019-01-17 DIAGNOSIS — Z20822 Contact with and (suspected) exposure to covid-19: Secondary | ICD-10-CM | POA: Insufficient documentation

## 2019-01-17 HISTORY — DX: Attention-deficit hyperactivity disorder, unspecified type: F90.9

## 2019-01-17 LAB — BASIC METABOLIC PANEL
Anion gap: 11 (ref 5–15)
BUN: 13 mg/dL (ref 6–20)
CO2: 27 mmol/L (ref 22–32)
Calcium: 9.4 mg/dL (ref 8.9–10.3)
Chloride: 103 mmol/L (ref 98–111)
Creatinine, Ser: 0.83 mg/dL (ref 0.61–1.24)
GFR calc Af Amer: >60 mL/min (ref >60)
GFR calc non Af Amer: >60 mL/min (ref >60)
Glucose, Bld: 110 mg/dL — ABNORMAL HIGH (ref 70–99)
Potassium: 4.5 mmol/L (ref 3.5–5.1)
Sodium: 141 mmol/L (ref 135–145)

## 2019-01-17 LAB — HEPATIC FUNCTION PANEL
ALT: 24 U/L (ref 0–44)
AST: 27 U/L (ref 15–41)
Albumin: 3.9 g/dL (ref 3.5–5.0)
Alkaline Phosphatase: 72 U/L (ref 38–126)
Bilirubin, Direct: 0.1 mg/dL (ref 0.0–0.2)
Indirect Bilirubin: 0.4 mg/dL (ref 0.3–0.9)
Total Bilirubin: 0.5 mg/dL (ref 0.3–1.2)
Total Protein: 7.4 g/dL (ref 6.5–8.1)

## 2019-01-17 LAB — CBC
HCT: 51.9 % (ref 39.0–52.0)
Hemoglobin: 17.6 g/dL — ABNORMAL HIGH (ref 13.0–17.0)
MCH: 33.1 pg (ref 26.0–34.0)
MCHC: 33.9 g/dL (ref 30.0–36.0)
MCV: 97.7 fL (ref 80.0–100.0)
Platelets: 196 10*3/uL (ref 150–400)
RBC: 5.31 MIL/uL (ref 4.22–5.81)
RDW: 12.7 % (ref 11.5–15.5)
WBC: 8.1 10*3/uL (ref 4.0–10.5)
nRBC: 0 % (ref 0.0–0.2)

## 2019-01-17 NOTE — Progress Notes (Signed)
Your procedure is scheduled on Tuesday Jan 21, 2019.   Report to [facility] Main Entrance "A" at 0530 A.M., and check in at the Admitting office.             Your surgery or procedure is scheduled for 7:30 AM  Call this number if you have problems the morning of surgery:  [phone]   Call [phone] if you have any questions prior to your surgery date Monday-Friday 8am-4pm   Remember:  DO NOT eat or drink after midnight the night before your surgery DO Not take any medications the Morning of Surgery Starting today: STOP taking any Aspirin (unless otherwise instructed by your surgeon), Aleve, Naproxen, Ibuprofen, Motrin, Advil, Goody's, BC's, all herbal medications, fish oil, and all vitamins.    Special instructions:   [place]- Preparing For Surgery  Before surgery, you can play an important role. Because skin is not sterile, your skin needs to be as free of germs as possible. You can reduce the number of germs on your skin by washing with CHG (chlorahexidine gluconate) Soap before surgery.  CHG is an antiseptic cleaner which kills germs and bonds with the skin to continue killing germs even after washing.    Oral Hygiene is also important to reduce your risk of infection.  Remember - BRUSH YOUR TEETH THE MORNING OF SURGERY WITH YOUR REGULAR TOOTHPASTE  Please do not use if you have an allergy to CHG or antibacterial soaps. If your skin becomes reddened/irritated stop using the CHG.  Do not shave (including legs and underarms) for at least 48 hours prior to first CHG shower. It is OK to shave your face.  Please follow these instructions carefully.   1. Shower the NIGHT BEFORE SURGERY and the MORNING OF SURGERY with CHG Soap.   2. If you chose to wash your hair, wash your hair first as usual with your normal shampoo.  3. After you shampoo, wash your face and private area with the soap you use at home, then rinse your hair and body thoroughly to remove the shampoo and soap.     4. Use CHG as you would any other liquid soap. You can apply CHG directly to the skin and wash gently with a scrungie or a clean washcloth.   Apply the CHG Soap to your body ONLY FROM THE NECK DOWN.  Do not use on open wounds or open sores. Avoid contact with your eyes, ears, mouth and genitals (private parts).   5. Wash thoroughly, paying special attention to the area where your surgery will be performed.  6. Thoroughly rinse your body with warm water from the neck down.  7. DO NOT shower/wash with your normal soap after using and rinsing off the CHG Soap.  8. Pat yourself dry with a CLEAN TOWEL.  9. Wear CLEAN PAJAMAS to bed the night before surgery, wear comfortable clothes the morning of surgery  10. Place CLEAN SHEETS on your bed the night of your first shower and DO NOT SLEEP WITH PETS.  Day of Surgery: Shower as instructed above. Do not wear lotions, powders, colognes, or deodorant Please wear clean clothes to the hospital/surgery center.   Remember to brush your teeth WITH YOUR REGULAR TOOTHPASTE.   Do not wear jewelry  Men may shave face and neck.  Do not bring valuables to the hospital.  [facility] is not responsible for any belongings or valuables.  If you are a smoker, DO NOT Smoke 24 hours prior  to surgery  If you wear a CPAP at night please bring your mask, tubing, and machine the morning of surgery   Remember that you must have someone to transport you home after your surgery, and remain with you for 24 hours if you are discharged the same day.   Please bring cases for contacts, glasses, hearing aids, dentures or bridgework because it cannot be worn into surgery.    Leave your suitcase in the car.  After surgery it may be brought to your room.  For patients admitted to the hospital, discharge time will be determined by your treatment team.  Patients discharged the day of surgery will not be allowed to drive home.   Please read over the following fact  sheets that you were given: Pain Booklet, Coughing and Deep Breathing  Surgical Site Infections.

## 2019-01-17 NOTE — Progress Notes (Addendum)
PCP - Dr Jill Alexanders  Cardiologist - no  Chest x-ray - na  EKG - 01/16/2018  Stress Test - no  ECHO - no  Cardiac Cath - no  Sleep Study - no CPAP - no  LABS-CBC, CMP  Joshua Henry has a history of drink more alcohol than he reported today. ASA-no  ERAS-no  HA1C-na Fasting Blood Sugar - na Checks Blood Sugar ___o__ times a day  Anesthesia-  Pt denies having chest pain, sob, or fever at this time. All instructions explained to the pt, with a verbal understanding of the material. Pt agrees to go over the instructions while at home for a better understanding. Pt also instructed to self quarantine after being tested for COVID-19. The opportunity to ask questions was provided.  I instructed patient No Smoking anything within 24 hours of surgery.

## 2019-01-18 LAB — NOVEL CORONAVIRUS, NAA (HOSP ORDER, SEND-OUT TO REF LAB; TAT 18-24 HRS): SARS-CoV-2, NAA: NOT DETECTED

## 2019-01-21 ENCOUNTER — Ambulatory Visit (HOSPITAL_COMMUNITY): Payer: No Typology Code available for payment source | Admitting: Physician Assistant

## 2019-01-21 ENCOUNTER — Other Ambulatory Visit: Payer: Self-pay

## 2019-01-21 ENCOUNTER — Ambulatory Visit (HOSPITAL_COMMUNITY)
Admission: RE | Admit: 2019-01-21 | Discharge: 2019-01-21 | Disposition: A | Discharge status: Home / Self Care | Payer: No Typology Code available for payment source | Attending: General Surgery | Admitting: General Surgery

## 2019-01-21 ENCOUNTER — Encounter (HOSPITAL_COMMUNITY): Payer: Self-pay | Admitting: General Surgery

## 2019-01-21 ENCOUNTER — Encounter (HOSPITAL_COMMUNITY)
Admission: RE | Disposition: A | Discharge status: Home / Self Care | Payer: Self-pay | Source: Home / Self Care | Attending: General Surgery

## 2019-01-21 DIAGNOSIS — F17200 Nicotine dependence, unspecified, uncomplicated: Secondary | ICD-10-CM | POA: Diagnosis not present

## 2019-01-21 DIAGNOSIS — I10 Essential (primary) hypertension: Secondary | ICD-10-CM | POA: Diagnosis not present

## 2019-01-21 DIAGNOSIS — K4090 Unilateral inguinal hernia, without obstruction or gangrene, not specified as recurrent: Secondary | ICD-10-CM | POA: Diagnosis present

## 2019-01-21 DIAGNOSIS — Z79899 Other long term (current) drug therapy: Secondary | ICD-10-CM | POA: Diagnosis not present

## 2019-01-21 HISTORY — PX: INGUINAL HERNIA REPAIR: SHX194

## 2019-01-21 HISTORY — PX: INSERTION OF MESH: SHX5868

## 2019-01-21 SURGERY — REPAIR, HERNIA, INGUINAL, LAPAROSCOPIC
Anesthesia: General | Site: Inguinal | Laterality: Left

## 2019-01-21 MED ORDER — CHLORHEXIDINE GLUCONATE CLOTH 2 % EX PADS: 6.0000 | MEDICATED_PAD | Freq: Once | CUTANEOUS | Status: DC

## 2019-01-21 MED ORDER — CELECOXIB 200 MG PO CAPS
200.0000 mg | ORAL_CAPSULE | ORAL | Status: AC
  Administered 2019-01-21: 06:00:00 200 mg via ORAL
  Filled 2019-01-21: qty 1

## 2019-01-21 MED ORDER — PROPOFOL 10 MG/ML IV BOLUS
INTRAVENOUS | Status: DC | PRN
  Administered 2019-01-21: 160 mg via INTRAVENOUS
  Administered 2019-01-21: 40 mg via INTRAVENOUS

## 2019-01-21 MED ORDER — CEFAZOLIN SODIUM-DEXTROSE 2-4 GM/100ML-% IV SOLN
2.0000 g | INTRAVENOUS | Status: DC
  Filled 2019-01-21: qty 100

## 2019-01-21 MED ORDER — VANCOMYCIN HCL 1000 MG IV SOLR
INTRAVENOUS | Status: DC | PRN
  Administered 2019-01-21: 1000 mg via INTRAVENOUS

## 2019-01-21 MED ORDER — OXYCODONE HCL 5 MG PO TABS
ORAL_TABLET | ORAL | Status: AC
  Filled 2019-01-21: qty 1

## 2019-01-21 MED ORDER — 0.9 % SODIUM CHLORIDE (POUR BTL) OPTIME
TOPICAL | Status: DC | PRN
  Administered 2019-01-21: 08:00:00 1000 mL

## 2019-01-21 MED ORDER — OXYCODONE HCL 5 MG/5ML PO SOLN: 5.0000 mg | Freq: Once | ORAL | Status: AC | PRN

## 2019-01-21 MED ORDER — FENTANYL CITRATE (PF) 250 MCG/5ML IJ SOLN
INTRAMUSCULAR | Status: DC | PRN
  Administered 2019-01-21: 100 ug via INTRAVENOUS
  Administered 2019-01-21: 50 ug via INTRAVENOUS

## 2019-01-21 MED ORDER — HYDROMORPHONE HCL 1 MG/ML IJ SOLN: 0.2500 mg | INTRAMUSCULAR | Status: DC | PRN

## 2019-01-21 MED ORDER — MIDAZOLAM HCL 2 MG/2ML IJ SOLN
INTRAMUSCULAR | Status: DC | PRN
  Administered 2019-01-21: 2 mg via INTRAVENOUS

## 2019-01-21 MED ORDER — OXYCODONE HCL 5 MG PO TABS
5.0000 mg | ORAL_TABLET | Freq: Once | ORAL | Status: AC | PRN
  Administered 2019-01-21: 09:00:00 5 mg via ORAL

## 2019-01-21 MED ORDER — ONDANSETRON HCL 4 MG/2ML IJ SOLN
INTRAMUSCULAR | Status: DC | PRN
  Administered 2019-01-21: 4 mg via INTRAVENOUS

## 2019-01-21 MED ORDER — PROPOFOL 10 MG/ML IV BOLUS
INTRAVENOUS | Status: AC
  Filled 2019-01-21: qty 40

## 2019-01-21 MED ORDER — ROCURONIUM BROMIDE 10 MG/ML (PF) SYRINGE
PREFILLED_SYRINGE | INTRAVENOUS | Status: DC | PRN
  Administered 2019-01-21: 80 mg via INTRAVENOUS

## 2019-01-21 MED ORDER — BUPIVACAINE HCL (PF) 0.25 % IJ SOLN
INTRAMUSCULAR | Status: AC
  Filled 2019-01-21: qty 30

## 2019-01-21 MED ORDER — PROMETHAZINE HCL 25 MG/ML IJ SOLN: 6.2500 mg | INTRAMUSCULAR | Status: DC | PRN

## 2019-01-21 MED ORDER — VANCOMYCIN HCL IN DEXTROSE 1-5 GM/200ML-% IV SOLN
INTRAVENOUS | Status: AC
  Filled 2019-01-21: qty 200

## 2019-01-21 MED ORDER — ACETAMINOPHEN 500 MG PO TABS
1000.0000 mg | ORAL_TABLET | ORAL | Status: AC
  Administered 2019-01-21: 06:00:00 1000 mg via ORAL
  Filled 2019-01-21: qty 2

## 2019-01-21 MED ORDER — LIDOCAINE 2% (20 MG/ML) 5 ML SYRINGE
INTRAMUSCULAR | Status: DC | PRN
  Administered 2019-01-21: 80 mg via INTRAVENOUS

## 2019-01-21 MED ORDER — BUPIVACAINE HCL 0.25 % IJ SOLN
INTRAMUSCULAR | Status: DC | PRN
  Administered 2019-01-21: 6 mL

## 2019-01-21 MED ORDER — FENTANYL CITRATE (PF) 250 MCG/5ML IJ SOLN
INTRAMUSCULAR | Status: AC
  Filled 2019-01-21: qty 5

## 2019-01-21 MED ORDER — MIDAZOLAM HCL 2 MG/2ML IJ SOLN
INTRAMUSCULAR | Status: AC
  Filled 2019-01-21: qty 2

## 2019-01-21 MED ORDER — LACTATED RINGERS IV SOLN: INTRAVENOUS | Status: DC | PRN

## 2019-01-21 MED ORDER — MEPERIDINE HCL 25 MG/ML IJ SOLN: 6.2500 mg | INTRAMUSCULAR | Status: DC | PRN

## 2019-01-21 MED ORDER — TRAMADOL HCL 50 MG PO TABS: 50.0000 mg | ORAL_TABLET | Freq: Four times a day (QID) | ORAL | 0 refills | Status: DC | PRN

## 2019-01-21 MED ORDER — DEXAMETHASONE SODIUM PHOSPHATE 10 MG/ML IJ SOLN
INTRAMUSCULAR | Status: DC | PRN
  Administered 2019-01-21: 10 mg via INTRAVENOUS

## 2019-01-21 MED ORDER — SUGAMMADEX SODIUM 200 MG/2ML IV SOLN
INTRAVENOUS | Status: DC | PRN
  Administered 2019-01-21: 250 mg via INTRAVENOUS

## 2019-01-21 MED ORDER — DEXMEDETOMIDINE HCL IN NACL 400 MCG/100ML IV SOLN
INTRAVENOUS | Status: DC | PRN
  Administered 2019-01-21 (×3): 8 ug via INTRAVENOUS

## 2019-01-21 MED FILL — traMADol HCL 50 MG TABS: 50 | 5 days supply | Qty: 20 | Fill #0

## 2019-01-21 SURGICAL SUPPLY — 41 items
CANISTER SUCT 3000ML PPV (MISCELLANEOUS) IMPLANT
COVER SURGICAL LIGHT HANDLE (MISCELLANEOUS) ×2 IMPLANT
COVER WAND RF STERILE (DRAPES) ×2 IMPLANT
DEFOGGER SCOPE WARMER CLEARIFY (MISCELLANEOUS) IMPLANT
DERMABOND ADVANCED (GAUZE/BANDAGES/DRESSINGS) ×1
DERMABOND ADVANCED .7 DNX12 (GAUZE/BANDAGES/DRESSINGS) ×1 IMPLANT
DISSECTOR BLUNT TIP ENDO 5MM (MISCELLANEOUS) IMPLANT
ELECT REM PT RETURN 9FT ADLT (ELECTROSURGICAL) ×2
ELECTRODE REM PT RTRN 9FT ADLT (ELECTROSURGICAL) ×1 IMPLANT
GLOVE BIO SURGEON STRL SZ7.5 (GLOVE) ×3 IMPLANT
GOWN STRL REUS W/ TWL LRG LVL3 (GOWN DISPOSABLE) ×2 IMPLANT
GOWN STRL REUS W/ TWL XL LVL3 (GOWN DISPOSABLE) ×1 IMPLANT
GOWN STRL REUS W/TWL LRG LVL3 (GOWN DISPOSABLE) ×2
GOWN STRL REUS W/TWL XL LVL3 (GOWN DISPOSABLE) ×1
KIT BASIN OR (CUSTOM PROCEDURE TRAY) ×2 IMPLANT
KIT TURNOVER KIT B (KITS) ×2 IMPLANT
MESH 3DMAX 5X7 LT XLRG (Mesh General) ×1 IMPLANT
NDL INSUFFLATION 14GA 120MM (NEEDLE) IMPLANT
NEEDLE INSUFFLATION 14GA 120MM (NEEDLE) IMPLANT
NS IRRIG 1000ML POUR BTL (IV SOLUTION) ×2 IMPLANT
PAD ARMBOARD 7.5X6 YLW CONV (MISCELLANEOUS) ×4 IMPLANT
RELOAD STAPLE 4.0 BLU F/HERNIA (INSTRUMENTS) ×1 IMPLANT
RELOAD STAPLE 4.8 BLK F/HERNIA (STAPLE) IMPLANT
RELOAD STAPLE HERNIA 4.0 BLUE (INSTRUMENTS) ×2 IMPLANT
RELOAD STAPLE HERNIA 4.8 BLK (STAPLE) IMPLANT
SCISSORS LAP 5X35 DISP (ENDOMECHANICALS) ×2 IMPLANT
SET IRRIG TUBING LAPAROSCOPIC (IRRIGATION / IRRIGATOR) IMPLANT
SET TUBE SMOKE EVAC HIGH FLOW (TUBING) ×2 IMPLANT
STAPLER HERNIA 12 8.5 360D (INSTRUMENTS) ×2 IMPLANT
SUT MNCRL AB 4-0 PS2 18 (SUTURE) ×2 IMPLANT
SUT VIC AB 1 CT1 27 (SUTURE)
SUT VIC AB 1 CT1 27XBRD ANBCTR (SUTURE) IMPLANT
SYRINGE TOOMEY DISP (SYRINGE) ×2 IMPLANT
TOWEL GREEN STERILE (TOWEL DISPOSABLE) ×2 IMPLANT
TOWEL GREEN STERILE FF (TOWEL DISPOSABLE) ×2 IMPLANT
TRAY FOLEY W/BAG SLVR 14FR (SET/KITS/TRAYS/PACK) ×2 IMPLANT
TRAY LAPAROSCOPIC MC (CUSTOM PROCEDURE TRAY) ×2 IMPLANT
TROCAR OPTICAL SHORT 5MM (TROCAR) ×2 IMPLANT
TROCAR OPTICAL SLV SHORT 5MM (TROCAR) ×2 IMPLANT
TROCAR XCEL 12X100 BLDLESS (ENDOMECHANICALS) ×2 IMPLANT
WATER STERILE IRR 1000ML POUR (IV SOLUTION) ×2 IMPLANT

## 2019-01-21 NOTE — Anesthesia Preprocedure Evaluation (Signed)
Anesthesia Evaluation  Patient identified by MRN, date of birth, ID band Patient awake    Reviewed: Allergy & Precautions, NPO status , Patient's Chart, lab work & pertinent test results  Airway Mallampati: II  TM Distance: >3 FB Neck ROM: Full    Dental no notable dental hx.    Pulmonary neg pulmonary ROS, Current Smoker and Patient abstained from smoking.,    Pulmonary exam normal breath sounds clear to auscultation       Cardiovascular hypertension, negative cardio ROS Normal cardiovascular exam Rhythm:Regular Rate:Normal     Neuro/Psych negative neurological ROS  negative psych ROS   GI/Hepatic negative GI ROS, Neg liver ROS,   Endo/Other  negative endocrine ROS  Renal/GU negative Renal ROS  negative genitourinary   Musculoskeletal negative musculoskeletal ROS (+)   Abdominal   Peds negative pediatric ROS (+)  Hematology negative hematology ROS (+)   Anesthesia Other Findings   Reproductive/Obstetrics negative OB ROS                             Anesthesia Physical Anesthesia Plan  ASA: II  Anesthesia Plan: General   Post-op Pain Management:    Induction: Intravenous  PONV Risk Score and Plan: 1 and Ondansetron  Airway Management Planned: Oral ETT  Additional Equipment:   Intra-op Plan:   Post-operative Plan: Extubation in OR  Informed Consent: I have reviewed the patients History and Physical, chart, labs and discussed the procedure including the risks, benefits and alternatives for the proposed anesthesia with the patient or authorized representative who has indicated his/her understanding and acceptance.     Dental advisory given  Plan Discussed with: CRNA  Anesthesia Plan Comments:         Anesthesia Quick Evaluation

## 2019-01-21 NOTE — Op Note (Signed)
01/21/2019  8:19 AM  PATIENT:  Joshua Henry  54 y.o. male  PRE-OPERATIVE DIAGNOSIS:  LEFT INGUINAL HERNIA  POST-OPERATIVE DIAGNOSIS:  LEFT INDIRECT INGUINAL HERNIA, LARGE  PROCEDURE:  Procedure(s): LAPAROSCOPIC LEFT  INGUINAL HERNIA REPAIR WITH MESH (Left) Insertion Of Mesh (Left)  SURGEON:  Surgeon(s) and Role:    Ralene Ok, MD - Primary  ANESTHESIA:   local and general  EBL:  minimal   BLOOD ADMINISTERED:none  DRAINS: none   LOCAL MEDICATIONS USED:  BUPIVICAINE   SPECIMEN:  No Specimen  DISPOSITION OF SPECIMEN:  N/A  COUNTS:  YES  TOURNIQUET:  * No tourniquets in log *  DICTATION: .Dragon Dictation Counts: reported as correct x 2   Findings:  The patient had a large sized left indirect hernia and a moderated sized cord lipoma   Indications for procedure:  The patient is a 54 year old male with a left inguinal hernia for several months. Patient complained of symptomatology to the left inguinal area. The patient was taken back for elective inguinal hernia repair.   Details of the procedure: The patient was taken back to the operating room. The patient was placed in supine position with bilateral SCDs in place.  The patient was prepped and draped in the usual sterile fashion.  After appropriate anitbiotics were confirmed, a time-out was confirmed and all facts were verified.   An Charlie Pitter was placed.  0.25% Marcaine was used to infiltrate the umbilical area. A 11-blade was used to cut down the skin and blunt dissection was used to get the anterior fashion.  The anterior fascia was incised approximately 1 cm and the muscles were retracted laterally. Blunt dissection was then used to create a space in the preperitoneal area. At this time a 10 mm camera was then introduced into the space and advanced the pubic tubercle and a 12 mm trocar was placed over this and insufflation was started.  At this time and space was created from medial to laterally the  preperitoneal space.  Cooper's ligament was initially cleaned off.  The hernia sac was identified in the indirect space. Dissection of the hernia sac and cord structures was undertaken the vas deferens was identified and protected in all parts of the case. A small cord lipoma was also dissected out of the hernia space.   Once the hernia sac was taken down to approximately the umbilicus a Left Bard 3D Max mesh, size: Rachelle Hora, was  introduced into the preperitoneal space.  The mesh was brought over to cover the direct and indirect hernia spaces.  This was anchored into place and secured to Cooper's ligament with 4.60mm staples from a [place] hernia stapler. It was anchored to the anterior abdominal wall with 4.8 mm staples. The hernia sac was seen lying posterior to the mesh. There was no staples placed laterally. The insufflation was evacuated and the peritoneum was seen posterior to the mesh. The trochars were removed. The anterior fascia was reapproximated using #1 Vicryl on a UR- 6.  Intra-abdominal air was evacuated and the Veress needle removed. The skin was reapproximated using 4-0 Monocryl subcuticular fashion and Dermabond. The patient was awakened from general anesthesia and taken to recovery in stable condition.    PLAN OF CARE: Discharge to home after PACU  PATIENT DISPOSITION:  PACU - hemodynamically stable.   Delay start of Pharmacological VTE agent (>24hrs) due to surgical blood loss or risk of bleeding: not applicable

## 2019-01-21 NOTE — H&P (Signed)
History of Present Illness The patient is a 54 year old male who presents with an inguinal hernia. Referred by: Dr. Redmond Henry  Chief Complaint: Left inguinal hernia  Patient is a 54 year old male with history of ADD, who comes in with a one-year history of a left inguinal hernia. He states that the hernia was present on his gotten larger. He notices a small bulge to the left inguinal area. He states he does have some discomfort as well as burning sensations with stretching. Patient otherwise has had no signs or symptoms of incarceration or strangulation.  Patient has had no previous abdominal surgery.     Past Surgical History   Knee Surgery Left.    Diagnostic Studies History Colonoscopy 1-5 years ago    Allergies   No Known Drug Allergies [10/24/2018]:  Allergies Reconciled    Medication History  Amphetamine-Dextroamphetamine (10MG  Tablet, Oral) Active.  hydroCHLOROthiazide (12.5MG  Capsule, Oral) Active.  Medications Reconciled    Social History Alcohol use Heavy alcohol use.  Caffeine use Carbonated beverages, Coffee.  Illicit drug use Prefer to discuss with provider.  Tobacco use Current every day smoker.    Family History  Alcohol Abuse Father, Mother, Sister, Son.  Arthritis Mother.  Breast Cancer Mother.  Melanoma Sister.    Other Problems Alcohol Abuse  Heart murmur  High blood pressure    Review of Systems  General Present- Night Sweats. Not Present- Appetite Loss, Chills, Fatigue, Fever, Weight Gain and Weight Loss.  Skin Present- Dryness. Not Present- Change in Wart/Mole, Hives, Jaundice, New Lesions, Non-Healing Wounds, Rash and Ulcer.  HEENT Present- Wears glasses/contact lenses. Not Present- Earache, Hearing Loss, Hoarseness, Nose Bleed, Oral Ulcers, Ringing in the Ears, Seasonal Allergies, Sinus Pain, Sore Throat, Visual Disturbances and Yellow Eyes.  Respiratory Not Present- Bloody sputum, Chronic Cough, Difficulty Breathing, Snoring and Wheezing.   Breast Not Present- Breast Mass, Breast Pain, Nipple Discharge and Skin Changes.  Cardiovascular Not Present- Chest Pain, Difficulty Breathing Lying Down, Leg Cramps, Palpitations, Rapid Heart Rate, Shortness of Breath and Swelling of Extremities.  Gastrointestinal Present- Abdominal Pain and Bloating. Not Present- Bloody Stool, Change in Bowel Habits, Chronic diarrhea, Constipation, Difficulty Swallowing, Excessive gas, Gets full quickly at meals, Hemorrhoids, Indigestion, Nausea, Rectal Pain and Vomiting.  Male Genitourinary Not Present- Blood in Urine, Change in Urinary Stream, Frequency, Impotence, Nocturia, Painful Urination, Urgency and Urine Leakage.  Musculoskeletal Not Present- Back Pain, Joint Pain, Joint Stiffness, Muscle Pain, Muscle Weakness and Swelling of Extremities.  Neurological Present- Headaches. Not Present- Decreased Memory, Fainting, Numbness, Seizures, Tingling, Tremor, Trouble walking and Weakness.  Psychiatric Not Present- Anxiety, Bipolar, Change in Sleep Pattern, Depression, Fearful and Frequent crying.  Endocrine Not Present- Cold Intolerance, Excessive Hunger, Hair Changes, Heat Intolerance, Hot flashes and New Diabetes.  Hematology Not Present- Blood Thinners, Easy Bruising, Excessive bleeding, Gland problems, HIV and Persistent Infections.  All other systems negative   BP 140/87   Pulse 68   Temp 98.2 F (36.8 C) (Oral)   Resp 18   Ht 5\' 10"  (1.778 m)   Wt 81.6 kg   SpO2 95%   BMI 25.81 kg/m     Physical Exam  The physical exam findings are as follows:  Note: Constitutional: No acute distress, conversant, appears stated age  Eyes: Anicteric sclerae, moist conjunctiva, no lid lag  Neck: No thyromegaly, trachea midline, no cervical lymphadenopathy  Lungs: Clear to auscultation biilaterally, normal respiratory effot  Cardiovascular: regular rate & rhythm, no murmurs, no peripheal edema, pedal pulses 2+  GI: Soft, no masses or hepatosplenomegaly, non-tender  to palpation  MSK: Normal gait, no clubbing cyanosis, edema  Skin: No rashes, palpation reveals normal skin turgor  Psychiatric: Appropriate judgment and insight, oriented to person, place, and time  Abdomen  Inspection  Hernias - Left - Inguinal hernia - Reducible - Left.   Assessment & Plan   LEFT INGUINAL HERNIA (K40.90)  Impression: 54 year old male with a left inguinal hernia  1. The patient will like to proceed to the operating room for laparoscopic left inguinal hernia repair with mesh.  2. I discussed with the patient the signs and symptoms of incarceration and strangulation and the need to proceed to the ER should they occur.  3. I discussed with the patient the risks and benefits of the procedure to include but not limited to: Infection, bleeding, damage to surrounding structures, possible need for further surgery, possible nerve pain, and possible recurrence. The patient was understanding and wishes to proceed.

## 2019-01-21 NOTE — Transfer of Care (Signed)
Immediate Anesthesia Transfer of Care Note  Patient: Joshua Henry  Procedure(s) Performed: LAPAROSCOPIC LEFT  INGUINAL HERNIA REPAIR WITH MESH (Left Inguinal) Insertion Of Mesh (Left Inguinal)  Patient Location: PACU  Anesthesia Type:General  Level of Consciousness: drowsy and patient cooperative  Airway & Oxygen Therapy: Patient Spontanous Breathing  Post-op Assessment: Report given to RN, Post -op Vital signs reviewed and stable and Patient moving all extremities X 4  Post vital signs: Reviewed and stable  Last Vitals:  Vitals Value Taken Time  BP 136/79 01/21/19 0836  Temp 36.6 C 01/21/19 0835  Pulse 55 01/21/19 0836  Resp 24 01/21/19 0836  SpO2 96 % 01/21/19 0836  Vitals shown include unvalidated device data.  Last Pain:  Vitals:   01/21/19 0614  TempSrc: Oral  PainSc:       Patients Stated Pain Goal: 2 ([id number])  Complications: No apparent anesthesia complications

## 2019-01-21 NOTE — Anesthesia Procedure Notes (Signed)
Procedure Name: Intubation Date/Time: 01/21/2019 7:49 AM Performed by: Larene Beach, CRNA Pre-anesthesia Checklist: Patient identified, Emergency Drugs available, Suction available and Patient being monitored Patient Re-evaluated:Patient Re-evaluated prior to induction Oxygen Delivery Method: Circle system utilized Preoxygenation: Pre-oxygenation with 100% oxygen Induction Type: IV induction Ventilation: Mask ventilation without difficulty Laryngoscope Size: Mac and 4 Grade View: Grade II Tube type: Oral Tube size: 7.5 mm Number of attempts: 1 Airway Equipment and Method: Stylet Placement Confirmation: ETT inserted through vocal cords under direct vision,  positive ETCO2 and breath sounds checked- equal and bilateral Secured at: 22 cm Tube secured with: Tape Dental Injury: Teeth and Oropharynx as per pre-operative assessment

## 2019-01-21 NOTE — Discharge Instructions (Signed)
[facility] _______[facility] °INGUINAL HERNIA REPAIR: POST OP INSTRUCTIONS ° °Always review your discharge instruction sheet given to you by the facility where your surgery was performed. °IF YOU HAVE DISABILITY OR FAMILY LEAVE FORMS, YOU MUST BRING THEM TO THE OFFICE FOR PROCESSING.   °DO NOT GIVE THEM TO YOUR DOCTOR. ° °1. A  prescription for pain medication may be given to you upon discharge.  Take your pain medication as prescribed, if needed.  If narcotic pain medicine is not needed, then you may take acetaminophen (Tylenol) or ibuprofen (Advil) as needed. °2. Take your usually prescribed medications unless otherwise directed. °If you need a refill on your pain medication, please contact your pharmacy.  They will contact our office to request authorization. Prescriptions will not be filled after 5 pm or on week-ends. °3. You should follow a light diet the first 24 hours after arrival home, such as soup and crackers, etc.  Be sure to include lots of fluids daily.  Resume your normal diet the day after surgery. °4.Most patients will experience some swelling and bruising around the umbilicus or in the groin and scrotum.  Ice packs and reclining will help.  Swelling and bruising can take several days to resolve.  °6. It is common to experience some constipation if taking pain medication after surgery.  Increasing fluid intake and taking a stool softener (such as Colace) will usually help or prevent this problem from occurring.  A mild laxative (Milk of Magnesia or Miralax) should be taken according to package directions if there are no bowel movements after 48 hours. °7. Unless discharge instructions indicate otherwise, you may remove your bandages 24-48 hours after surgery, and you may shower at that time.  You may have steri-strips (small skin tapes) in place directly over the incision.  These strips should be left on the skin for 7-10 days.  If your surgeon used skin glue on the incision, you may  shower in 24 hours.  The glue will flake off over the next 2-3 weeks.  Any sutures or staples will be removed at the office during your follow-up visit. °8. ACTIVITIES:  You may resume regular (light) daily activities beginning the next day--such as daily self-care, walking, climbing stairs--gradually increasing activities as tolerated.  You may have sexual intercourse when it is comfortable.  Refrain from any heavy lifting or straining until approved by your doctor. ° °a.You may drive when you are no longer taking prescription pain medication, you can comfortably wear a seatbelt, and you can safely maneuver your car and apply brakes. °b.RETURN TO WORK:   °_____________________________________________ ° °9.You should see your doctor in the office for a follow-up appointment approximately 2-3 weeks after your surgery.  Make sure that you call for this appointment within a day or two after you arrive home to insure a convenient appointment time. °10.OTHER INSTRUCTIONS: _________________________ °   _____________________________________ ° °WHEN TO CALL YOUR DOCTOR: °1. Fever over 101.0 °2. Inability to urinate °3. Nausea and/or vomiting °4. Extreme swelling or bruising °5. Continued bleeding from incision. °6. Increased pain, redness, or drainage from the incision ° °The clinic staff is available to answer your questions during regular business hours.  Please don’t hesitate to call and ask to speak to one of the nurses for clinical concerns.  If you have a medical emergency, go to the nearest emergency room or call 911.  A surgeon from [facility] is always on call at the hospital ° ° °[address], [address], [place], [place]  [address] ? ° [address] [address], [place],    [address] °[phone] ? [phone] ? FAX [phone] °Web site: [email or web address] ° °

## 2019-01-22 ENCOUNTER — Encounter: Payer: Self-pay | Admitting: *Deleted

## 2019-01-23 NOTE — Anesthesia Postprocedure Evaluation (Signed)
Anesthesia Post Note  Patient: Joshua Henry  Procedure(s) Performed: LAPAROSCOPIC LEFT  INGUINAL HERNIA REPAIR WITH MESH (Left Inguinal) Insertion Of Mesh (Left Inguinal)     Patient location during evaluation: PACU Anesthesia Type: General Level of consciousness: awake and alert Pain management: pain level controlled Vital Signs Assessment: post-procedure vital signs reviewed and stable Respiratory status: spontaneous breathing, nonlabored ventilation and respiratory function stable Cardiovascular status: blood pressure returned to baseline and stable Postop Assessment: no apparent nausea or vomiting Anesthetic complications: no    Last Vitals:  Vitals:   01/21/19 0900 01/21/19 0911  BP: 121/76 118/66  Pulse: (!) 58 (!) 55  Resp: 20 16  Temp: 36.6 C   SpO2: 93% 93%    Last Pain:  Vitals:   01/21/19 0840  TempSrc:   PainSc: Asleep   Pain Goal: Patients Stated Pain Goal: 2 (01/21/19 0611)                 Lynda Rainwater

## 2019-02-08 MED FILL — AMPHETAMINE-DEXTROAMPHETAMI: 10 | 30 days supply | Qty: 60 | Fill #0

## 2019-03-08 MED FILL — AMPHETAMINE-DEXTROAMPHETAMI: 10 | 30 days supply | Qty: 60 | Fill #0

## 2019-04-14 ENCOUNTER — Telehealth: Payer: Self-pay | Admitting: Family Medicine

## 2019-04-14 ENCOUNTER — Other Ambulatory Visit: Payer: Self-pay | Admitting: Family Medicine

## 2019-04-14 DIAGNOSIS — I10 Essential (primary) hypertension: Secondary | ICD-10-CM

## 2019-04-14 DIAGNOSIS — F988 Other specified behavioral and emotional disorders with onset usually occurring in childhood and adolescence: Secondary | ICD-10-CM

## 2019-04-14 MED ORDER — AMPHETAMINE-DEXTROAMPHETAMINE 10 MG PO TABS: 10.0000 mg | ORAL_TABLET | Freq: Two times a day (BID) | ORAL | 0 refills | Status: DC

## 2019-04-14 MED ORDER — AMPHETAMINE-DEXTROAMPHETAMINE 10 MG PO TABS: ORAL_TABLET | ORAL | 0 refills | Status: DC

## 2019-04-14 MED ORDER — HYDROCHLOROTHIAZIDE 12.5 MG PO CAPS: 12.5000 mg | ORAL_CAPSULE | Freq: Every day | ORAL | 3 refills | Status: DC

## 2019-04-14 MED FILL — AMPHETAMINE-DEXTROAMPHETAMI: 10 | 30 days supply | Qty: 60 | Fill #0

## 2019-04-14 MED FILL — HYDROCHLOROTHIAZIDE 12.5 MG: 12.5 | 90 days supply | Qty: 90 | Fill #0

## 2019-04-14 NOTE — Telephone Encounter (Signed)
Pt needs refill on Adderall and Hydrochlorothiazide sent to the [facility]

## 2019-04-14 NOTE — Telephone Encounter (Signed)
Please decline due to you already sending it in. [facility]

## 2019-06-03 ENCOUNTER — Ambulatory Visit
Admission: RE | Admit: 2019-06-03 | Discharge: 2019-06-03 | Disposition: A | Discharge status: Home / Self Care | Payer: No Typology Code available for payment source | Source: Ambulatory Visit | Attending: Family Medicine | Admitting: Family Medicine

## 2019-06-03 ENCOUNTER — Ambulatory Visit (INDEPENDENT_AMBULATORY_CARE_PROVIDER_SITE_OTHER): Payer: No Typology Code available for payment source | Admitting: Family Medicine

## 2019-06-03 ENCOUNTER — Encounter: Payer: Self-pay | Admitting: Family Medicine

## 2019-06-03 ENCOUNTER — Other Ambulatory Visit: Payer: Self-pay

## 2019-06-03 VITALS — BP 138/88 | HR 88 | Temp 98.0°F | Ht 69.75 in | Wt 179.6 lb

## 2019-06-03 DIAGNOSIS — Z0000 Encounter for general adult medical examination without abnormal findings: Secondary | ICD-10-CM

## 2019-06-03 DIAGNOSIS — G8929 Other chronic pain: Secondary | ICD-10-CM

## 2019-06-03 DIAGNOSIS — E785 Hyperlipidemia, unspecified: Secondary | ICD-10-CM

## 2019-06-03 DIAGNOSIS — J309 Allergic rhinitis, unspecified: Secondary | ICD-10-CM | POA: Diagnosis not present

## 2019-06-03 DIAGNOSIS — F988 Other specified behavioral and emotional disorders with onset usually occurring in childhood and adolescence: Secondary | ICD-10-CM

## 2019-06-03 DIAGNOSIS — I10 Essential (primary) hypertension: Secondary | ICD-10-CM

## 2019-06-03 DIAGNOSIS — Z86010 Personal history of colonic polyps: Secondary | ICD-10-CM

## 2019-06-03 DIAGNOSIS — F17200 Nicotine dependence, unspecified, uncomplicated: Secondary | ICD-10-CM

## 2019-06-03 DIAGNOSIS — M25511 Pain in right shoulder: Secondary | ICD-10-CM

## 2019-06-03 MED ORDER — LOSARTAN POTASSIUM-HCTZ 50-12.5 MG PO TABS: 1.0000 | ORAL_TABLET | Freq: Every day | ORAL | 3 refills | Status: DC

## 2019-06-03 MED FILL — LOSARTAN-HCTZ 50-12.5 MG TA: 50-12.5 | 90 days supply | Qty: 90 | Fill #0

## 2019-06-03 NOTE — Progress Notes (Signed)
   Subjective:    Patient ID: Joshua Henry, male    DOB: 09-30-1965, 54 y.o.   MRN: [id number]  HPI He is here for complete examination.  He does complain of right shoulder pain.  Is originally injured 2 years ago when he fell landing on his elbow causing shoulder pain.  It is now starting to wake him up in the middle of the night.  He also notes some discomfort with the throwing motion.  He does not note any laxity.  He does have underlying ADD and continues to do well on his present dosing of Adderall.   One pill will last him 3 to 4 hours.  Sometimes he does not take the second dose.  He does have underlying allergies but is having no difficulty with them.  He also has a history of colonic polyps and is slightly overdue on repeat colonoscopy.  He continues to smoke but is not ready to quit.  He has cut back on his alcohol use and is now using it intermittently and seems to be slowly cutting back on that.  He did have recent surgery and that blood work was reviewed.  He continues on HCTZ and has had no difficulty with that.  Family and social history as well as health maintenance immunizations was reviewed.   Review of Systems  All other systems reviewed and are negative.      Objective:   Physical Exam Alert and in no distress. Tympanic membranes and canals are normal. Pharyngeal area is normal. Neck is supple without adenopathy or thyromegaly. Cardiac exam shows a regular sinus rhythm without murmurs or gallops. Lungs are clear to auscultation.  Abdominal exam shows no masses or tenderness with normal bowel sounds right shoulder exam does show full motion with pain on abduction as well as external rotation.  Neer's and Hawkins test was uncomfortable.  Positive sulcus sign.       Assessment & Plan:  Routine general medical examination at a health care facility  Personal history of colonic polyps:   He will be set up to see Dr. Hilarie Fredrickson again.  Allergic rhinitis, unspecified  seasonality, unspecified trigger:   Continue with as needed allergy therapy Hyperlipidemia LDL goal <130 - Plan: Lipid panel  Essential hypertension - Plan: losartan-hydrochlorothiazide (HYZAAR) 50-12.5 MG tablet:   He will stop the HCTZ.  He does have a cuff at home and is to call me in 1 month to let me know how blood pressure is doing.  Current smoker: I will work with him when he is ready to quit. Attention deficit disorder, unspecified hyperactivity presence  Chronic right shoulder pain - Plan: DG Shoulder Right, Ambulatory referral to Physical Therapy:   I explained that he does have some shoulder laxity.  We will attempt physical therapy first to see if that will help stabilize his shoulder and if no improvement, I will refer to orthopedics.

## 2019-06-04 LAB — LIPID PANEL
Chol/HDL Ratio: 3.8 ratio (ref 0.0–5.0)
Cholesterol, Total: 245 mg/dL — ABNORMAL HIGH (ref 100–199)
HDL: 64 mg/dL (ref >39)
LDL Chol Calc (NIH): 164 mg/dL — ABNORMAL HIGH (ref 0–99)
Triglycerides: 100 mg/dL (ref 0–149)
VLDL Cholesterol Cal: 17 mg/dL (ref 5–40)

## 2019-06-16 MED FILL — AMPHETAMINE-DEXTROAMPHETAMI: 10 | 30 days supply | Qty: 60 | Fill #0

## 2019-07-08 ENCOUNTER — Other Ambulatory Visit: Payer: Self-pay

## 2019-07-08 ENCOUNTER — Encounter: Payer: Self-pay | Admitting: Orthopaedic Surgery

## 2019-07-08 ENCOUNTER — Ambulatory Visit (INDEPENDENT_AMBULATORY_CARE_PROVIDER_SITE_OTHER): Payer: No Typology Code available for payment source | Admitting: Orthopaedic Surgery

## 2019-07-08 VITALS — Ht 70.0 in | Wt 180.0 lb

## 2019-07-08 DIAGNOSIS — M25511 Pain in right shoulder: Secondary | ICD-10-CM | POA: Diagnosis not present

## 2019-07-08 DIAGNOSIS — G8929 Other chronic pain: Secondary | ICD-10-CM | POA: Diagnosis not present

## 2019-07-08 NOTE — Progress Notes (Signed)
Office Visit Note   Patient: Joshua Henry           Date of Birth: 29-Oct-1965           MRN: [id number] Visit Date: 07/08/2019              Requested by: Denita Lung, MD [place],  [place] [address] PCP: Denita Lung, MD   Assessment & Plan: Visit Diagnoses:  1. Chronic right shoulder pain     Plan: Askia is had an issue with his right shoulder for almost 2 years.  He stepped in a hole landing on his elbow thrusting his shoulder superiorly.  Since that time he has had difficulty sleeping and raising his arm over his head without pain.  He is tried some over-the-counter medicines but still having some compromise of his activities.  He does have impingement and empty can testing.  Strength is good.  We have had a discussion regarding different treatment options.  I will proceed with an MRI scan to rule out rotator cuff tear.  Follow-Up Instructions: Return After MRI scan right shoulder.   Orders:  Orders Placed This Encounter  Procedures  . MR SHOULDER RIGHT WO CONTRAST   No orders of the defined types were placed in this encounter.     Procedures: No procedures performed   Clinical Data: No additional findings.   Subjective: Chief Complaint  Patient presents with  . Right Shoulder - Pain  Patient presents today for right shoulder pain. He said that he fell a few years ago and has had persistent pain since. He said that the pain has worsened in the last year. He has difficulty sleeping. He has pain with range of motion. No numbness or tingling. He does not take anything for pain. He is left hand dominant. He saw his PCP in June and had x-rays taken.  I reviewed the films in the PACS system.  There are some mild degenerative change at the acromioclavicular joint and a normal space between the humeral head and acromion.  The humeral head is centered about the glenoid.  No ectopic calcification or evidence of any acute changes.  Had his  second Covid vaccine injection earlier today  HPI  Review of Systems   Objective: Vital Signs: Ht 5\' 10"  (1.778 m)   Wt 180 lb (81.6 kg)   BMI 25.83 kg/m   Physical Exam Constitutional:      Appearance: He is well-developed.  Eyes:     Pupils: Pupils are equal, round, and reactive to light.  Pulmonary:     Effort: Pulmonary effort is normal.  Skin:    General: Skin is warm and dry.  Neurological:     Mental Status: He is alert and oriented to person, place, and time.  Psychiatric:        Behavior: Behavior normal.     Ortho Exam awake alert and oriented x3.  Comfortable sitting.  He is able to raise his right arm fully overhead with somewhat of a circuitous arc of motion.  Minimally positive impingement but positive empty can testing.  No grinding or grating.  No pain at the acromioclavicular joint.  Mild pain along the anterior subacromial region.  Good strength.  Good grip and release. Specialty Comments:  No specialty comments available.  Imaging: No results found.   PMFS History: Patient Active Problem List   Diagnosis Date Noted  . Pain in right shoulder 07/08/2019  . Personal history of  colonic polyps 06/09/2016  . Essential hypertension 03/08/2015  . Alcohol use 02/22/2015  . Current smoker 02/22/2015  . Hyperlipidemia LDL goal <130 02/22/2015  . Rhinitis, allergic 02/22/2015  . ADD (attention deficit disorder) 03/13/2011   Past Medical History:  Diagnosis Date  . ADHD   . Allergy    RHINITIS  . H/O: rheumatic fever   . Heart murmur    TRIVIAL TRICUSPPID INSUFF.  Marland Kitchen Hypertension   . Smoker     Family History  Problem Relation Age of Onset  . Cancer Father   . Cancer Maternal Grandfather   . Colon cancer Maternal Grandfather 65    Past Surgical History:  Procedure Laterality Date  . COLONOSCOPY     with polyps  . INGUINAL HERNIA REPAIR Left 01/21/2019   Procedure: LAPAROSCOPIC LEFT  INGUINAL HERNIA REPAIR WITH MESH;  Surgeon: Ralene Ok, MD;  Location: [facility];  Service: General;  Laterality: Left;  . INSERTION OF MESH Left 01/21/2019   Procedure: Insertion Of Mesh;  Surgeon: Ralene Ok, MD;  Location: [facility];  Service: General;  Laterality: Left;  . KNEE ARTHROSCOPY Left    torn meniscus  . TONSILLECTOMY    . VASECTOMY  2008   Social History   Occupational History  . Not on file  Tobacco Use  . Smoking status: Current Every Day Smoker    Packs/day: 0.75    Years: 30.00    Pack years: 22.50  . Smokeless tobacco: Never Used  Vaping Use  . Vaping Use: Some days  . Substances: Nicotine  Substance and Sexual Activity  . Alcohol use: Yes    Alcohol/week: 6.0 standard drinks    Types: 6 Cans of beer per week  . Drug use: Yes    Frequency: 5.0 times per week    Types: Marijuana  . Sexual activity: Yes

## 2019-07-24 ENCOUNTER — Other Ambulatory Visit: Payer: Self-pay | Admitting: Family Medicine

## 2019-07-24 DIAGNOSIS — F988 Other specified behavioral and emotional disorders with onset usually occurring in childhood and adolescence: Secondary | ICD-10-CM

## 2019-07-24 MED ORDER — AMPHETAMINE-DEXTROAMPHETAMINE 10 MG PO TABS: ORAL_TABLET | ORAL | 0 refills | Status: DC

## 2019-07-24 MED ORDER — AMPHETAMINE-DEXTROAMPHETAMINE 10 MG PO TABS: 10.0000 mg | ORAL_TABLET | Freq: Two times a day (BID) | ORAL | 0 refills | Status: DC

## 2019-07-24 MED FILL — AMPHETAMINE SALTS 10 MG: 10 | 30 days supply | Qty: 60 | Fill #0

## 2019-07-24 NOTE — Telephone Encounter (Signed)
[facility] is requesting to fill pt adderal. Please advise Kh

## 2019-08-09 ENCOUNTER — Other Ambulatory Visit: Payer: Self-pay

## 2019-08-09 ENCOUNTER — Ambulatory Visit
Admission: RE | Admit: 2019-08-09 | Discharge: 2019-08-09 | Disposition: A | Discharge status: Home / Self Care | Payer: No Typology Code available for payment source | Source: Ambulatory Visit | Attending: Orthopaedic Surgery | Admitting: Orthopaedic Surgery

## 2019-08-09 DIAGNOSIS — M25511 Pain in right shoulder: Secondary | ICD-10-CM

## 2019-08-09 DIAGNOSIS — G8929 Other chronic pain: Secondary | ICD-10-CM

## 2019-08-19 ENCOUNTER — Ambulatory Visit (INDEPENDENT_AMBULATORY_CARE_PROVIDER_SITE_OTHER): Payer: No Typology Code available for payment source | Admitting: Orthopedic Surgery

## 2019-08-19 ENCOUNTER — Encounter: Payer: Self-pay | Admitting: Orthopedic Surgery

## 2019-08-19 DIAGNOSIS — M75111 Incomplete rotator cuff tear or rupture of right shoulder, not specified as traumatic: Secondary | ICD-10-CM | POA: Diagnosis not present

## 2019-08-19 DIAGNOSIS — M19019 Primary osteoarthritis, unspecified shoulder: Secondary | ICD-10-CM | POA: Insufficient documentation

## 2019-08-19 DIAGNOSIS — M19011 Primary osteoarthritis, right shoulder: Secondary | ICD-10-CM | POA: Diagnosis not present

## 2019-08-19 DIAGNOSIS — M7521 Bicipital tendinitis, right shoulder: Secondary | ICD-10-CM | POA: Diagnosis not present

## 2019-08-19 NOTE — Progress Notes (Signed)
Office Visit Note   Patient: Joshua Henry           Date of Birth: 03-19-1965           MRN: [id number] Visit Date: 08/19/2019              Requested by: Denita Lung, MD [place],  [place] [address] PCP: Denita Lung, MD   Assessment & Plan: Visit Diagnoses:  1. Nontraumatic incomplete tear of right rotator cuff   2. Arthritis of right acromioclavicular joint   3. Biceps tendonitis on right     Plan:  #1: At this time I had discussed with him conservative versus operative treatment.  I told him that with this pathology that best course of action would be that of a arthroscopic debridement with distal clavicle resection, mini open rotator cuff repair, and biceps tenodesis with possible patch.  He states that he cannot do anything till January.  I told him how important was to have this soon so as not have worsening tearing and possibly something that is not able to be repaired.  He is adamant about not being able to have surgery till after January.    He will follow back up in December or earlier if he would consider having the surgery.  Follow-Up Instructions: No follow-ups on file.   Face-to-face time spent with patient was greater than 40 minutes.  Greater than 50% of the time was spent in counseling and coordination of care.  Orders:  No orders of the defined types were placed in this encounter.  No orders of the defined types were placed in this encounter.     Procedures: No procedures performed   Clinical Data: No additional findings.   Subjective: Chief Complaint  Patient presents with  . Right Shoulder - Follow-up    MRI review  Patient presents today for follow up on his right shoulder. He had an MRI on 08/09/2019. Patient states that he is doing the same as his last visit. He does not take anything for pain.   HPI  Review of Systems  Constitutional: Negative for fatigue.  HENT: Negative for ear pain.   Eyes: Negative  for pain.  Respiratory: Negative for shortness of breath.   Cardiovascular: Negative for leg swelling.  Gastrointestinal: Negative for constipation and diarrhea.  Endocrine: Negative for cold intolerance and heat intolerance.  Genitourinary: Negative for difficulty urinating.  Musculoskeletal: Negative for joint swelling.  Skin: Negative for rash.  Allergic/Immunologic: Negative for food allergies.  Neurological: Negative for weakness.  Hematological: Does not bruise/bleed easily.  Psychiatric/Behavioral: Positive for sleep disturbance.     Objective: Vital Signs: Ht 5\' 10"  (1.778 m)   Wt 180 lb (81.6 kg)   BMI 25.83 kg/m   Physical Exam Constitutional:      Appearance: Normal appearance. He is well-developed and normal weight.  HENT:     Head: Normocephalic.  Eyes:     Pupils: Pupils are equal, round, and reactive to light.  Pulmonary:     Effort: Pulmonary effort is normal.  Skin:    General: Skin is warm and dry.  Neurological:     Mental Status: He is alert and oriented to person, place, and time.  Psychiatric:        Behavior: Behavior normal.     Ortho Exam  Exam today reveals with the shoulder at 90 degrees of abduction and 70 degrees of external rotation and 60 degrees of internal rotation.  I can forward flex him to about 100 degrees.  Abduction to about 105 degrees.  Positive empty can test.  Specialty Comments:  No specialty comments available.  Imaging: MR SHOULDER RIGHT WO CONTRAST  Result Date: 08/09/2019 CLINICAL DATA:  Chronic lateral right shoulder pain. EXAM: MRI OF THE RIGHT SHOULDER WITHOUT CONTRAST TECHNIQUE: Multiplanar, multisequence MR imaging of the shoulder was performed. No intravenous contrast was administered. COMPARISON:  X-ray 06/03/2019 FINDINGS: Rotator cuff: High-grade bursal sided tear of the supraspinatus tendon and anterior infraspinatus tendon with involvement of up to 75% of the tendon depth (series 6, images 7-11). There is  retraction of the bursal tendon fibers of up to 11 mm. Tear measures approximately 14 mm in AP dimension. No full-thickness component. There is background of moderate tendinosis. Mild subscapularis tendinosis without tear. Intact teres minor. Muscles: Mild supraspinatus and infraspinatus intramuscular edema. No atrophy or fatty infiltration. Biceps long head: Intra-articular biceps tendinosis. There is an accessory tendon slip or long segment longitudinal split tear of the long head biceps tendon. Acromioclavicular Joint: Moderate arthropathy of the acromioclavicular joint. Mild subacromial-subdeltoid bursitis. Glenohumeral Joint: No joint effusion. No chondral defect. Labrum: Grossly intact, but evaluation is limited by lack of intraarticular fluid. Bones: Subcortical marrow edema at the greater tuberosity. No acute fracture. No dislocation. No suspicious bone lesion. Other: None. IMPRESSION: 1. High-grade bursal sided tear of the supraspinatus tendon and anterior infraspinatus tendon with involvement of up to 75% of the tendon depth. No full-thickness component. 2. Intra-articular biceps tendinosis. There is an accessory tendon slip or long segment longitudinal split tear of the long head biceps tendon. 3. Moderate acromioclavicular arthropathy with mild subacromial-subdeltoid bursitis. Electronically Signed   By: Davina Poke D.O.   On: 08/09/2019 19:07    PMFS History: Current Outpatient Medications  Medication Sig Dispense Refill  . [START ON 09/24/2019] amphetamine-dextroamphetamine (ADDERALL) 10 MG tablet TAKE 1 TABLET BY MOUTH 2 TIMES DAILY. FILL 06/14/19 60 tablet 0  . [START ON 08/24/2019] amphetamine-dextroamphetamine (ADDERALL) 10 MG tablet Take 1 tablet (10 mg total) by mouth 2 (two) times daily. 60 tablet 0  . amphetamine-dextroamphetamine (ADDERALL) 10 MG tablet TAKE 1 TABLET BY MOUTH 2 TIMES DAILY WITH A MEAL 60 tablet 0  . losartan-hydrochlorothiazide (HYZAAR) 50-12.5 MG tablet Take 1  tablet by mouth daily. 90 tablet 3  . traMADol (ULTRAM) 50 MG tablet Take 1 tablet (50 mg total) by mouth every 6 (six) hours as needed. (Patient not taking: Reported on 08/19/2019) 20 tablet 0   No current facility-administered medications for this visit.    Patient Active Problem List   Diagnosis Date Noted  . Nontraumatic incomplete tear of right rotator cuff 08/19/2019  . AC (acromioclavicular) arthritis 08/19/2019  . Biceps tendonitis on right 08/19/2019  . Pain in right shoulder 07/08/2019  . Personal history of colonic polyps 06/09/2016  . Essential hypertension 03/08/2015  . Alcohol use 02/22/2015  . Current smoker 02/22/2015  . Hyperlipidemia LDL goal <130 02/22/2015  . Rhinitis, allergic 02/22/2015  . ADD (attention deficit disorder) 03/13/2011   Past Medical History:  Diagnosis Date  . ADHD   . Allergy    RHINITIS  . H/O: rheumatic fever   . Heart murmur    TRIVIAL TRICUSPPID INSUFF.  Marland Kitchen Hypertension   . Smoker     Family History  Problem Relation Age of Onset  . Cancer Father   . Cancer Maternal Grandfather   . Colon cancer Maternal Grandfather 63    Past Surgical History:  Procedure Laterality Date  . COLONOSCOPY     with polyps  . INGUINAL HERNIA REPAIR Left 01/21/2019   Procedure: LAPAROSCOPIC LEFT  INGUINAL HERNIA REPAIR WITH MESH;  Surgeon: Ralene Ok, MD;  Location: [facility];  Service: General;  Laterality: Left;  . INSERTION OF MESH Left 01/21/2019   Procedure: Insertion Of Mesh;  Surgeon: Ralene Ok, MD;  Location: [facility];  Service: General;  Laterality: Left;  . KNEE ARTHROSCOPY Left    torn meniscus  . TONSILLECTOMY    . VASECTOMY  2008   Social History   Occupational History  . Not on file  Tobacco Use  . Smoking status: Current Every Day Smoker    Packs/day: 0.75    Years: 30.00    Pack years: 22.50  . Smokeless tobacco: Never Used  Vaping Use  . Vaping Use: Some days  . Substances: Nicotine  Substance and Sexual Activity  .  Alcohol use: Yes    Alcohol/week: 6.0 standard drinks    Types: 6 Cans of beer per week  . Drug use: Yes    Frequency: 5.0 times per week    Types: Marijuana  . Sexual activity: Yes

## 2019-09-02 MED FILL — AMPHETAMINE SALTS 10 MG: 10 | 30 days supply | Qty: 60 | Fill #0

## 2019-10-01 MED FILL — AMPHETAMINE SALTS 10 MG: 10 | 30 days supply | Qty: 60 | Fill #0

## 2019-10-31 MED FILL — LOSARTAN-HCTZ 50-12.5 MG TA: 50-12.5 | 90 days supply | Qty: 90 | Fill #0

## 2019-11-10 ENCOUNTER — Telehealth: Payer: Self-pay

## 2019-11-10 ENCOUNTER — Other Ambulatory Visit: Payer: Self-pay | Admitting: Family Medicine

## 2019-11-10 DIAGNOSIS — F988 Other specified behavioral and emotional disorders with onset usually occurring in childhood and adolescence: Secondary | ICD-10-CM

## 2019-11-10 MED ORDER — AMPHETAMINE-DEXTROAMPHETAMINE 10 MG PO TABS: 10.0000 mg | ORAL_TABLET | Freq: Two times a day (BID) | ORAL | 0 refills | Status: DC

## 2019-11-10 MED ORDER — AMPHETAMINE-DEXTROAMPHETAMINE 10 MG PO TABS: ORAL_TABLET | ORAL | 0 refills | Status: DC

## 2019-11-10 MED FILL — AMPHETAMINE SALTS 10 MG: 10 | 30 days supply | Qty: 60 | Fill #0

## 2019-11-10 NOTE — Telephone Encounter (Signed)
I called pt. To reschedule his CPE in June because you are off that day and I got him rescheduled. He let me know that he needed a referral on his Adderall and also the his index finger on his lt. Hand has been numb and cold.

## 2019-11-12 ENCOUNTER — Ambulatory Visit: Payer: No Typology Code available for payment source | Admitting: Family Medicine

## 2019-11-13 ENCOUNTER — Other Ambulatory Visit: Payer: Self-pay

## 2019-11-13 ENCOUNTER — Ambulatory Visit (INDEPENDENT_AMBULATORY_CARE_PROVIDER_SITE_OTHER): Payer: No Typology Code available for payment source | Admitting: Family Medicine

## 2019-11-13 ENCOUNTER — Encounter: Payer: Self-pay | Admitting: Family Medicine

## 2019-11-13 VITALS — BP 130/80 | HR 78 | Temp 98.2°F | Wt 180.0 lb

## 2019-11-13 DIAGNOSIS — L608 Other nail disorders: Secondary | ICD-10-CM

## 2019-11-13 DIAGNOSIS — S6992XA Unspecified injury of left wrist, hand and finger(s), initial encounter: Secondary | ICD-10-CM

## 2019-11-13 DIAGNOSIS — R0989 Other specified symptoms and signs involving the circulatory and respiratory systems: Secondary | ICD-10-CM | POA: Diagnosis not present

## 2019-11-13 DIAGNOSIS — F17200 Nicotine dependence, unspecified, uncomplicated: Secondary | ICD-10-CM | POA: Diagnosis not present

## 2019-11-13 NOTE — Patient Instructions (Addendum)
We will contact you regarding your appointment with the the hand specialist.   I strongly encourage you to stop smoking. Look for healthy habits to substitute. Schedule with Dr. Redmond School to discuss this in depth.

## 2019-11-13 NOTE — Progress Notes (Signed)
   Subjective:    Patient ID: Joshua Henry, male    DOB: Jan 07, 1965, 54 y.o.   MRN: [id number]  HPI Patient Joshua Henry is a 54 year old male here for evaluation of his left index finger tingling that started a week and a half ago with a cold sensation. About a week after developing the cold sensation, patient reports jamming his finger on a floor tile at work. 2-3 days ago. Left index finger then became purple and affected digit nail began to bruise. Reports burning tingle sensation when touching the skin of the affected digit and generalized discomfort throughout tip of finger. States his finger feels better when he holds it and puts pressure on it. Denies numbness/tingling/sensation changes in all other digits.   Review of Systems  Constitutional: Negative for chills, fatigue and fever.  Musculoskeletal: Negative for joint swelling.  Skin: Positive for color change.       Left index finger purple  Neurological: Negative for weakness.         Objective:   Physical Exam Constitutional:      General: He is not in acute distress.    Appearance: Normal appearance. He is not ill-appearing.  Skin:    Capillary Refill: Capillary refill takes more than 3 seconds. Left index finger    Findings: Bruising present.     Comments: Left index finger cold to the touch Nailbed with bruising and the nail has lifted off of the nailbed  Neurological:     Mental Status: He is alert.     Sensory: Sensory deficit present.     Comments: Sensory deficit at tip of left index finger   BP 130/80   Pulse 78   Temp 98.2 F (36.8 C)   Wt 180 lb (81.6 kg)   BMI 25.83 kg/m      Assessment & Plan:  Decreased circulation in fingers or toes - Plan: Ambulatory referral to Orthopedic Surgery  Finger injury, left, initial encounter - Plan: Ambulatory referral to Orthopedic Surgery  Deformity of nail bed - Plan: Ambulatory referral to Orthopedic Surgery  Current smoker  Patient still has  sensation to injured digit distally. No ulceration or eschar tissue noted. Significantly decreased capillary refill Concerned about patient's vascular status and will refer him urgently to the hand specialist. Discover that patient's insurance requires special authorization for him to see out of network provider which applies to the hand center. Ortho care has agreed to see him in the morning.  This patient was seen in conjunction with Mare Ferrari, NP student. I examined patient and documented plan of care

## 2019-11-14 ENCOUNTER — Ambulatory Visit (INDEPENDENT_AMBULATORY_CARE_PROVIDER_SITE_OTHER): Payer: No Typology Code available for payment source

## 2019-11-14 ENCOUNTER — Ambulatory Visit: Payer: No Typology Code available for payment source | Admitting: Orthopaedic Surgery

## 2019-11-14 ENCOUNTER — Encounter: Payer: Self-pay | Admitting: Orthopaedic Surgery

## 2019-11-14 DIAGNOSIS — M79645 Pain in left finger(s): Secondary | ICD-10-CM

## 2019-11-14 NOTE — Progress Notes (Signed)
Office Visit Note   Patient: Joshua Henry           Date of Birth: 16-Sep-1965           MRN: [id number] Visit Date: 11/14/2019              Requested by: Girtha Rm, NP-C [place],  [place] [address] PCP: Denita Lung, MD   Assessment & Plan: Visit Diagnoses:  1. 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  This was then exacerbated by the recent injury.  I strongly encouraged the patient to quit smoking as I am concerned that continued smoking use could lead to development of Buerger's disease.  He understands the importance of smoking cessation.  If he continues to have symptoms we may need to consider referral to vascular surgery for a full vascular work-up and he may benefit from calcium channel blockers.  Follow-Up Instructions: Return if symptoms worsen or fail to improve.   Orders:  Orders Placed This Encounter  Procedures  . XR Finger Index Left   No orders of the defined types were placed in this encounter.     Procedures: No procedures performed   Clinical Data: No additional findings.   Subjective: Chief Complaint  Patient presents with  . Left Index Finger - Pain    Joshua Henry is a 54 year old gentleman referral from PCPs office for concern of ischemic left index finger for about 2 weeks in which he was experiencing numbness and coldness and this was exacerbated a week ago when he jammed it and the nail flipped up.  He currently has no pain.  He is left-handed.  Feels squeezing the fingertip makes it feel better.   Review of Systems  Constitutional: Negative.   All other systems reviewed and are negative.    Objective: Vital Signs: There were no vitals taken for this visit.  Physical Exam Vitals and nursing note reviewed.  Constitutional:      Appearance: He is  well-developed.  Pulmonary:     Effort: Pulmonary effort is normal.  Abdominal:     Palpations: Abdomen is soft.  Skin:    General: Skin is warm.  Neurological:     Mental Status: He is alert and oriented to person, place, and time.  Psychiatric:        Behavior: Behavior normal.        Thought Content: Thought content normal.        Judgment: Judgment normal.     Ortho Exam Left index finger shows small nailbed hematoma.  The nail appears to be intact and viable.  The pulp of the fingertip has a purplish hue that consistent with hematoma from recent injury.  There is no evidence of ischemia.  Capillary refill is about 3 seconds.  He has full range of motion.  Slight decreased sensation to the fingertip. Specialty Comments:  No specialty comments available.  Imaging: XR Finger Index Left  Result Date: 11/14/2019 No acute or structural abnormalities    PMFS History: Patient Active Problem List   Diagnosis Date Noted  . Nontraumatic incomplete tear of right rotator cuff 08/19/2019  . AC (acromioclavicular) arthritis 08/19/2019  . Biceps tendonitis on right 08/19/2019  . Pain in right shoulder 07/08/2019  . Personal history of colonic polyps 06/09/2016  . Essential hypertension  03/08/2015  . Alcohol use 02/22/2015  . Current smoker 02/22/2015  . Hyperlipidemia LDL goal <130 02/22/2015  . Rhinitis, allergic 02/22/2015  . ADD (attention deficit disorder) 03/13/2011   Past Medical History:  Diagnosis Date  . ADHD   . Allergy    RHINITIS  . H/O: rheumatic fever   . Heart murmur    TRIVIAL TRICUSPPID INSUFF.  Marland Kitchen Hypertension   . Smoker     Family History  Problem Relation Age of Onset  . Cancer Father   . Cancer Maternal Grandfather   . Colon cancer Maternal Grandfather 20    Past Surgical History:  Procedure Laterality Date  . COLONOSCOPY     with polyps  . INGUINAL HERNIA REPAIR Left 01/21/2019   Procedure: LAPAROSCOPIC LEFT  INGUINAL HERNIA REPAIR WITH MESH;   Surgeon: Ralene Ok, MD;  Location: [facility];  Service: General;  Laterality: Left;  . INSERTION OF MESH Left 01/21/2019   Procedure: Insertion Of Mesh;  Surgeon: Ralene Ok, MD;  Location: [facility];  Service: General;  Laterality: Left;  . KNEE ARTHROSCOPY Left    torn meniscus  . TONSILLECTOMY    . VASECTOMY  2008   Social History   Occupational History  . Not on file  Tobacco Use  . Smoking status: Current Every Day Smoker    Packs/day: 0.75    Years: 30.00    Pack years: 22.50  . Smokeless tobacco: Never Used  Vaping Use  . Vaping Use: Some days  . Substances: Nicotine  Substance and Sexual Activity  . Alcohol use: Yes    Alcohol/week: 6.0 standard drinks    Types: 6 Cans of beer per week  . Drug use: Yes    Frequency: 5.0 times per week    Types: Marijuana  . Sexual activity: Yes

## 2019-12-10 MED FILL — AMPHETAMINE SALTS 10 MG: 10 | 30 days supply | Qty: 60 | Fill #0

## 2020-01-14 MED FILL — AMPHETAMINE SALTS 10 MG: 10 | 30 days supply | Qty: 60 | Fill #0

## 2020-01-17 ENCOUNTER — Telehealth: Payer: Self-pay

## 2020-01-17 NOTE — Telephone Encounter (Signed)
P.A. ADDERALL  

## 2020-01-18 NOTE — Telephone Encounter (Signed)
Sent pt Mychart message

## 2020-01-18 NOTE — Telephone Encounter (Signed)
P.A. approved til 01/16/21

## 2020-02-13 ENCOUNTER — Other Ambulatory Visit: Payer: Self-pay | Admitting: Family Medicine

## 2020-02-13 DIAGNOSIS — F988 Other specified behavioral and emotional disorders with onset usually occurring in childhood and adolescence: Secondary | ICD-10-CM

## 2020-02-13 NOTE — Telephone Encounter (Signed)
Pt called and needs this medication refilled he uses [facility]

## 2020-02-14 ENCOUNTER — Other Ambulatory Visit: Payer: Self-pay | Admitting: Family Medicine

## 2020-02-14 MED ORDER — AMPHETAMINE-DEXTROAMPHETAMINE 10 MG PO TABS: ORAL_TABLET | ORAL | 0 refills | Status: DC

## 2020-02-14 MED ORDER — AMPHETAMINE-DEXTROAMPHETAMINE 10 MG PO TABS: 10.0000 mg | ORAL_TABLET | Freq: Two times a day (BID) | ORAL | 0 refills | Status: DC

## 2020-02-16 MED FILL — AMPHETAMINE SALTS 10 MG: 10 | 30 days supply | Qty: 60 | Fill #0

## 2020-03-24 ENCOUNTER — Telehealth: Payer: Self-pay | Admitting: Orthopaedic Surgery

## 2020-03-24 NOTE — Telephone Encounter (Signed)
Patient would like to schedule right rotator cuff surgery with Dr. Durward Fortes.   He would also like to speak with him about the down time and how long he will need to take off for work.  Please surgery sheet.   Pt's cb  [phone]

## 2020-03-25 NOTE — Telephone Encounter (Signed)
Have not seen since August-needs another appt to reevaluate

## 2020-03-25 NOTE — Telephone Encounter (Signed)
Called and spoke with patient. Scheduled for next week.

## 2020-03-31 ENCOUNTER — Encounter: Payer: Self-pay | Admitting: Orthopaedic Surgery

## 2020-03-31 ENCOUNTER — Ambulatory Visit (INDEPENDENT_AMBULATORY_CARE_PROVIDER_SITE_OTHER): Payer: BC Managed Care – PPO | Admitting: Orthopaedic Surgery

## 2020-03-31 ENCOUNTER — Other Ambulatory Visit: Payer: Self-pay

## 2020-03-31 VITALS — Ht 70.0 in | Wt 180.0 lb

## 2020-03-31 DIAGNOSIS — M75111 Incomplete rotator cuff tear or rupture of right shoulder, not specified as traumatic: Secondary | ICD-10-CM

## 2020-03-31 NOTE — Progress Notes (Signed)
Office Visit Note   Patient: Joshua Henry           Date of Birth: 04/18/1965           MRN: [id number] Visit Date: 03/31/2020              Requested by: Denita Lung, MD [place],  [place] [address] PCP: Denita Lung, MD   Assessment & Plan: Visit Diagnoses:  1. Nontraumatic incomplete tear of right rotator cuff     Plan: Joshua Henry had an MRI scan of his right shoulder in August of last year revealing a high-grade bursal sided tear of the supraspinatus involving up to 75% of the tendon depth.  There was some retraction of the bursal fibers with a tear measuring up to 11 mm and 14 mm in the AP dimension.  There was no obvious full-thickness component.  He had mild supra and infraspinatus intramuscular edema but no fatty atrophy or infiltration.  He had intra-articular biceps tendinosis with an accessory tendon slip or long segment longitudinal split tear of the long head of the biceps tendon.  He elected to defer surgery but notes that now it is reached the point was having trouble sleeping and more difficulty with overhead activity.  He has not had any further insult to the shoulder.  He like to proceed with surgery.  We had a long discussion regarding the surgery and that would include an arthroscopic SCD and DCR.  He might require a biceps tenodesis and I would make a small mini incision evaluate the rotator cuff.  There is no need to repeat the MRI scan.  I talked in detail about time off from work, use of sling, rehab and potential for incomplete relief of his pain.  He understands all above and want to proceed  Follow-Up Instructions: Return We will schedule rotator cuff surgery.   Orders:  No orders of the defined types were placed in this encounter.  No orders of the defined types were placed in this encounter.     Procedures: No procedures performed   Clinical Data: No additional findings.   Subjective: Chief Complaint  Patient presents  with  . Right Shoulder - Pain  Patient presents today for his right shoulder. He was last seen in August of last year and spoke about his options. He has since decided that he would like to pursue surgery.   HPI  Review of Systems   Objective: Vital Signs: Ht 5\' 10"  (1.778 m)   Wt 180 lb (81.6 kg)   BMI 25.83 kg/m   Physical Exam Constitutional:      Appearance: He is well-developed.  Eyes:     Pupils: Pupils are equal, round, and reactive to light.  Pulmonary:     Effort: Pulmonary effort is normal.  Skin:    General: Skin is warm and dry.  Neurological:     Mental Status: He is alert and oriented to person, place, and time.  Psychiatric:        Behavior: Behavior normal.     Ortho Exam awake alert and oriented x3.  Comfortable sitting.  Definite positive empty can test.  Negative Speed sign.  Good strength.  Does have some tenderness along the anterior supple clavicular region.  No grinding or crepitation.  Very mild tenderness at the [facility] joint.  Able to place his arm fully over his head with slight hesitation.  Abduction and 90 degrees with some discomfort in the anterior  and lateral subacromial region  Specialty Comments:  No specialty comments available.  Imaging: No results found.   PMFS History: Patient Active Problem List   Diagnosis Date Noted  . Nontraumatic incomplete tear of right rotator cuff 08/19/2019  . AC (acromioclavicular) arthritis 08/19/2019  . Biceps tendonitis on right 08/19/2019  . Pain in right shoulder 07/08/2019  . Personal history of colonic polyps 06/09/2016  . Essential hypertension 03/08/2015  . Alcohol use 02/22/2015  . Current smoker 02/22/2015  . Hyperlipidemia LDL goal <130 02/22/2015  . Rhinitis, allergic 02/22/2015  . ADD (attention deficit disorder) 03/13/2011   Past Medical History:  Diagnosis Date  . ADHD   . Allergy    RHINITIS  . H/O: rheumatic fever   . Heart murmur    TRIVIAL TRICUSPPID INSUFF.  Marland Kitchen Hypertension    . Smoker     Family History  Problem Relation Age of Onset  . Cancer Father   . Cancer Maternal Grandfather   . Colon cancer Maternal Grandfather 35    Past Surgical History:  Procedure Laterality Date  . COLONOSCOPY     with polyps  . INGUINAL HERNIA REPAIR Left 01/21/2019   Procedure: LAPAROSCOPIC LEFT  INGUINAL HERNIA REPAIR WITH MESH;  Surgeon: Ralene Ok, MD;  Location: [facility];  Service: General;  Laterality: Left;  . INSERTION OF MESH Left 01/21/2019   Procedure: Insertion Of Mesh;  Surgeon: Ralene Ok, MD;  Location: [facility];  Service: General;  Laterality: Left;  . KNEE ARTHROSCOPY Left    torn meniscus  . TONSILLECTOMY    . VASECTOMY  2008   Social History   Occupational History  . Not on file  Tobacco Use  . Smoking status: Current Every Day Smoker    Packs/day: 0.75    Years: 30.00    Pack years: 22.50  . Smokeless tobacco: Never Used  Vaping Use  . Vaping Use: Some days  . Substances: Nicotine  Substance and Sexual Activity  . Alcohol use: Yes    Alcohol/week: 6.0 standard drinks    Types: 6 Cans of beer per week  . Drug use: Yes    Frequency: 5.0 times per week    Types: Marijuana  . Sexual activity: Yes

## 2020-04-22 ENCOUNTER — Other Ambulatory Visit: Payer: Self-pay | Admitting: Orthopaedic Surgery

## 2020-04-22 ENCOUNTER — Other Ambulatory Visit: Payer: Self-pay | Admitting: Orthopedic Surgery

## 2020-04-22 ENCOUNTER — Other Ambulatory Visit (HOSPITAL_COMMUNITY): Payer: Self-pay

## 2020-04-22 ENCOUNTER — Telehealth: Payer: Self-pay

## 2020-04-22 DIAGNOSIS — M7541 Impingement syndrome of right shoulder: Secondary | ICD-10-CM

## 2020-04-22 DIAGNOSIS — M7521 Bicipital tendinitis, right shoulder: Secondary | ICD-10-CM

## 2020-04-22 DIAGNOSIS — M19011 Primary osteoarthritis, right shoulder: Secondary | ICD-10-CM

## 2020-04-22 DIAGNOSIS — S46011S Strain of muscle(s) and tendon(s) of the rotator cuff of right shoulder, sequela: Secondary | ICD-10-CM

## 2020-04-22 HISTORY — PX: ROTATOR CUFF REPAIR: SHX139

## 2020-04-22 MED ORDER — OXYCODONE-ACETAMINOPHEN 5-325 MG PO TABS
1.0000 | ORAL_TABLET | ORAL | 0 refills | Status: DC | PRN
  Filled 2020-04-22: qty 30, 3d supply, fill #0

## 2020-04-22 MED ORDER — OXYCODONE HCL 5 MG PO TABS: 5.0000 mg | ORAL_TABLET | ORAL | Status: DC | PRN

## 2020-04-22 NOTE — Telephone Encounter (Signed)
Please call and let them know prescription was sent to [facility]

## 2020-04-22 NOTE — Telephone Encounter (Signed)
Spoke with patient. Neither pharmacy got the prescription today. Upon looking into the chart it looks like it was entered as clinic administered instead of being sent to pharmacy. I apologized and told her it would be sent to [facility].

## 2020-04-22 NOTE — Telephone Encounter (Signed)
Patients wife called regarding rx for oxycodone patient went to the pharmacy to pick up rx and the rx is not there. Patient is requesting rx to be sent to [facility] outpatient pharmacy call back:[phone]

## 2020-04-22 NOTE — Telephone Encounter (Signed)
Sent new prescription to [facility]

## 2020-04-23 ENCOUNTER — Other Ambulatory Visit (HOSPITAL_COMMUNITY): Payer: Self-pay

## 2020-04-28 ENCOUNTER — Other Ambulatory Visit (HOSPITAL_COMMUNITY): Payer: Self-pay

## 2020-04-28 MED FILL — Losartan Potassium & Hydrochlorothiazide Tab 50-12.5 MG: ORAL | 30 days supply | Qty: 30 | Fill #0 | Status: AC

## 2020-04-29 ENCOUNTER — Ambulatory Visit (INDEPENDENT_AMBULATORY_CARE_PROVIDER_SITE_OTHER): Payer: BC Managed Care – PPO | Admitting: Orthopaedic Surgery

## 2020-04-29 ENCOUNTER — Encounter: Payer: Self-pay | Admitting: Orthopaedic Surgery

## 2020-04-29 ENCOUNTER — Other Ambulatory Visit (HOSPITAL_COMMUNITY): Payer: Self-pay

## 2020-04-29 ENCOUNTER — Other Ambulatory Visit: Payer: Self-pay

## 2020-04-29 VITALS — Ht 70.0 in | Wt 180.0 lb

## 2020-04-29 DIAGNOSIS — M75111 Incomplete rotator cuff tear or rupture of right shoulder, not specified as traumatic: Secondary | ICD-10-CM

## 2020-04-29 NOTE — Progress Notes (Signed)
Office Visit Note   Patient: Joshua Henry           Date of Birth: Nov 19, 1965           MRN: [id number] Visit Date: 04/29/2020              Requested by: Denita Lung, MD [place],  [place] [id number] PCP: Denita Lung, MD   Assessment & Plan: Visit Diagnoses:  1. Nontraumatic incomplete tear of right rotator cuff     Plan: 1 week status post right shoulder arthroscopic SCD and DCR and mini open rotator cuff tear repair of a large bursal surface supraspinatus tear.  Doing well.  Wounds are healing without problem.  Steri-Strips applied on wounds.  Now can shower.  Discussed passive range of motion.  We will start physical therapy at our facility and return in 2 weeks.  Okay to return to work on Monday, May 2 with limited activity.  Note given  Follow-Up Instructions: Return in about 2 weeks (around 05/13/2020).   Orders:  Orders Placed This Encounter  Procedures  . Ambulatory referral to Physical Therapy   No orders of the defined types were placed in this encounter.     Procedures: No procedures performed   Clinical Data: No additional findings.   Subjective: Chief Complaint  Patient presents with  . Right Shoulder - Follow-up    Right shoulder arthroscopy 04/22/2020  Patient presents today for follow up on his right shoulder. He had a right shoulder arthroscopy on  04/22/2020. He is now one week out from surgery. He is doing well. He does take a percocet daily.   HPI  Review of Systems   Objective: Vital Signs: Ht 5\' 10"  (1.778 m)   Wt 180 lb (81.6 kg)   BMI 25.83 kg/m   Physical Exam  Ortho Exam right shoulder incisions healing without problem.  Resolving ecchymosis.  Neurologically intact  Specialty Comments:  No specialty comments available.  Imaging: No results found.   PMFS History: Patient Active Problem List   Diagnosis Date Noted  . Nontraumatic incomplete tear of right rotator cuff 08/19/2019  . AC  (acromioclavicular) arthritis 08/19/2019  . Biceps tendonitis on right 08/19/2019  . Pain in right shoulder 07/08/2019  . Personal history of colonic polyps 06/09/2016  . Essential hypertension 03/08/2015  . Alcohol use 02/22/2015  . Current smoker 02/22/2015  . Hyperlipidemia LDL goal <130 02/22/2015  . Rhinitis, allergic 02/22/2015  . ADD (attention deficit disorder) 03/13/2011   Past Medical History:  Diagnosis Date  . ADHD   . Allergy    RHINITIS  . H/O: rheumatic fever   . Heart murmur    TRIVIAL TRICUSPPID INSUFF.  Marland Kitchen Hypertension   . Smoker     Family History  Problem Relation Age of Onset  . Cancer Father   . Cancer Maternal Grandfather   . Colon cancer Maternal Grandfather 68    Past Surgical History:  Procedure Laterality Date  . COLONOSCOPY     with polyps  . INGUINAL HERNIA REPAIR Left 01/21/2019   Procedure: LAPAROSCOPIC LEFT  INGUINAL HERNIA REPAIR WITH MESH;  Surgeon: Ralene Ok, MD;  Location: [place];  Service: General;  Laterality: Left;  . INSERTION OF MESH Left 01/21/2019   Procedure: Insertion Of Mesh;  Surgeon: Ralene Ok, MD;  Location: [place];  Service: General;  Laterality: Left;  . KNEE ARTHROSCOPY Left    torn meniscus  . TONSILLECTOMY    .  VASECTOMY  2008   Social History   Occupational History  . Not on file  Tobacco Use  . Smoking status: Current Every Day Smoker    Packs/day: 0.75    Years: 30.00    Pack years: 22.50  . Smokeless tobacco: Never Used  Vaping Use  . Vaping Use: Some days  . Substances: Nicotine  Substance and Sexual Activity  . Alcohol use: Yes    Alcohol/week: 6.0 standard drinks    Types: 6 Cans of beer per week  . Drug use: Yes    Frequency: 5.0 times per week    Types: Marijuana  . Sexual activity: Yes

## 2020-04-30 ENCOUNTER — Ambulatory Visit: Payer: BC Managed Care – PPO | Admitting: Physical Therapy

## 2020-05-05 ENCOUNTER — Encounter: Payer: Self-pay | Admitting: Rehabilitative and Restorative Service Providers"

## 2020-05-05 ENCOUNTER — Other Ambulatory Visit: Payer: Self-pay

## 2020-05-05 ENCOUNTER — Ambulatory Visit (INDEPENDENT_AMBULATORY_CARE_PROVIDER_SITE_OTHER): Payer: BC Managed Care – PPO | Admitting: Rehabilitative and Restorative Service Providers"

## 2020-05-05 DIAGNOSIS — M6281 Muscle weakness (generalized): Secondary | ICD-10-CM

## 2020-05-05 DIAGNOSIS — M25511 Pain in right shoulder: Secondary | ICD-10-CM

## 2020-05-05 DIAGNOSIS — R293 Abnormal posture: Secondary | ICD-10-CM | POA: Diagnosis not present

## 2020-05-05 DIAGNOSIS — G8929 Other chronic pain: Secondary | ICD-10-CM

## 2020-05-05 NOTE — Therapy (Addendum)
[facility] Physical Therapy [address] [place], [place], [address] Phone: [phone]   Fax:  [phone]  Physical Therapy Evaluation  Patient Details  Name: Joshua Henry MRN: [id number] Date of Birth: 1965-10-17 Referring Provider (PT): Dr. Durward Fortes   Encounter Date: 05/05/2020   PT End of Session - 05/05/20 0810    Visit Number 1    Number of Visits 20    Date for PT Re-Evaluation 07/14/20    Progress Note Due on Visit 10    PT Start Time 0810    PT Stop Time 0845    PT Time Calculation (min) 35 min    Activity Tolerance Patient tolerated treatment well    Behavior During Therapy [facility] for tasks assessed/performed           Past Medical History:  Diagnosis Date  . ADHD   . Allergy    RHINITIS  . H/O: rheumatic fever   . Heart murmur    TRIVIAL TRICUSPPID INSUFF.  Marland Kitchen Hypertension   . Smoker     Past Surgical History:  Procedure Laterality Date  . COLONOSCOPY     with polyps  . INGUINAL HERNIA REPAIR Left 01/21/2019   Procedure: LAPAROSCOPIC LEFT  INGUINAL HERNIA REPAIR WITH MESH;  Surgeon: Ralene Ok, MD;  Location: [place];  Service: General;  Laterality: Left;  . INSERTION OF MESH Left 01/21/2019   Procedure: Insertion Of Mesh;  Surgeon: Ralene Ok, MD;  Location: [place];  Service: General;  Laterality: Left;  . KNEE ARTHROSCOPY Left    torn meniscus  . TONSILLECTOMY    . VASECTOMY  2008    There were no vitals filed for this visit.   Subjective Assessment - 05/05/20 0813    Subjective Pt. indicated fall a few years ago with landing on elbow, progressively having pain.  Eventual MRI and surgery on 04/22/2020.  Pt. indicated consistent pain c some variation of level of pain symptoms.  Sleeping in bed with some trouble.  Wearing sling mostly but had taken off and home.   Pt. stated has returned to work this week, wearing sling.  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    Pertinent History  S/p Rt shoulder rotator cuff repair (mini open) - supraspinatus, SAD,DCR on 04/22/20    Limitations Lifting;House hold activities    Patient Stated Goals Reduce pain    Currently in Pain? Yes    Pain Score 9    at worst   Pain Location Shoulder    Pain Orientation Right    Pain Descriptors / Indicators Aching;Dull;Constant    Pain Type Surgical pain    Pain Onset 1 to 4 weeks ago    Pain Frequency Constant    Aggravating Factors  sleeping difficulty    Pain Relieving Factors ice, medicine    Effect of Pain on Daily Activities Limited in Rt arm use throughout day              [facility] PT Assessment - 05/05/20 0001      Assessment   Medical Diagnosis Rt shoulder mini open rotator cuff repair (supraspinatus, SAD, DCR)    Referring Provider (PT) Dr. Durward Fortes    Onset Date/Surgical Date 04/22/20    Hand Dominance Left      Precautions   Precautions Shoulder    Type of Shoulder Precautions PROM only for 6 weeks (until 06/10/2020)      Balance Screen   Has the patient fallen in  the past 6 months No    Has the patient had a decrease in activity level because of a fear of falling?  No    Is the patient reluctant to leave their home because of a fear of falling?  No      Prior Function   Level of Independence Independent      Cognition   Overall Cognitive Status Within Functional Limits for tasks assessed      Observation/Other Assessments   Focus on Therapeutic Outcomes (FOTO)  intake 42%, predicted 69%      Posture/Postural Control   Posture/Postural Control Postural limitations    Postural Limitations Rounded Shoulders      ROM / Strength   AROM / PROM / Strength Strength;PROM;AROM      AROM   Overall AROM Comments Lt shoulder ROM WFL at this time.  Rt AROM not tested due to surgical protocol    AROM Assessment Site Shoulder    Right/Left Shoulder Left;Right      PROM   Overall PROM Comments measured in supine, pain limited primarily in all directions    PROM  Assessment Site Shoulder    Right/Left Shoulder Left;Right    Right Shoulder Flexion 120 Degrees    Right Shoulder ABduction 115 Degrees    Right Shoulder Internal Rotation 58 Degrees   in 45 degrees abduction   Right Shoulder External Rotation 45 Degrees   in 45 degrees abduction     Strength   Overall Strength Comments Rt shoulder MMT not performed today due to surgical protocol    Strength Assessment Site Shoulder;Elbow;Forearm    Right/Left Shoulder Left;Right    Left Shoulder Flexion 5/5    Left Shoulder ABduction 5/5    Left Shoulder Internal Rotation 5/5    Left Shoulder External Rotation 5/5    Right/Left Elbow Left;Right    Left Elbow Flexion 5/5    Left Elbow Extension 5/5    Right/Left Forearm Left;Right      Palpation   Palpation comment No specific tenderness indicated      Special Tests   Other special tests Mid range inferior glides, posterior glides Rt glenohumeral joint normal to slight hypomobility                         [facility] Adult PT Treatment/Exercise - 05/05/20 0001      Exercises   Exercises Other Exercises    Other Exercises  HEP instruction/performance c cues for techniques, handout provided.  Trial set performed of each for comprehension and symptom assessment.  Exercises consisting of passive supine flexion c Lt arm help, seated and standing pendulum activity all directions, scapular retraction in sitting      Manual Therapy   Manual therapy comments PROM within pain tolerance as documented in objective data, g2 posterior glenohumeral joint mobilizations Rt                  PT Education - 05/05/20 0809    Education Details HEP, POC, protocol (PROM only at this time)    Person(s) Educated Patient    Methods Explanation;Demonstration;Verbal cues;Handout    Comprehension Returned demonstration;Verbalized understanding            PT Short Term Goals - 05/05/20 0810      PT SHORT TERM GOAL #1   Title Patient will  demonstrate independent use of home exercise program to maintain progress from in clinic treatments.    Time 3  Period Weeks    Status New    Target Date 05/26/20             PT Long Term Goals - 05/05/20 0905      PT LONG TERM GOAL #1   Title Patient will demonstrate/report pain at worst less than or equal to 2/10 to facilitate minimal limitation in daily activity secondary to pain symptoms.    Time 10    Period Weeks    Status New    Target Date 07/14/20      PT LONG TERM GOAL #2   Title v    Time 10    Period Weeks    Status New    Target Date 07/14/20      PT LONG TERM GOAL #3   Title Patient will demonstrate return to work/recreational activity at previous level of function without limitations secondary due to condition    Time 10    Period Weeks    Status New    Target Date 07/14/20      PT LONG TERM GOAL #4   Title Pt. will demonstrate Rt shoulder AROM WFL s symptoms to faclitate usual reaching, dressing, work  at [employer].    Time 10    Period Weeks    Status New    Target Date 07/14/20      PT LONG TERM GOAL #5   Title Pt. will demonstrate Rt arm MMT 5/5 throughout, dynamometry 85% of Lt to indicate ability to perform lifting, carrying at home and work at [employer].    Baseline Unable to measure due to protocol, will assess June    Time 10    Period Weeks    Status New    Target Date 07/14/20      Additional Long Term Goals   Additional Long Term Goals Yes      PT LONG TERM GOAL #6   Title Pt. will demonstrate FOTO outcome > or = 69% to indicate reduced disability due to condition.    Time 10    Period Weeks    Status New    Target Date 07/14/20                 Plan - 05/05/20 0902    Clinical Impression Statement Patient is a 55 y.o. who comes to clinic with complaints of Rt shoulder pain s/p recent surgery (mini open supraspinatus repair, SAD, DCR) on 04/22/2020 with mobility, strength and movement coordination deficits that impair their ability  to perform usual daily and recreational functional activities without increase difficulty/symptoms at this time.  Patient to benefit from skilled PT services to address impairments and limitations to improve to previous level of function without restriction secondary to condition.    Personal Factors and Comorbidities Comorbidity 1    Comorbidities HTN    Examination-Activity Limitations Sleep;Carry;Toileting;Dressing;Hygiene/Grooming;Lift;Reach Overhead    Examination-Participation Restrictions Occupation;Yard Work;Laundry;Community Activity;Driving;Cleaning    Stability/Clinical Decision Making Stable/Uncomplicated    Clinical Decision Making Low    Rehab Potential Good    PT Frequency --   1x/week 4 weeks, then 2x/week 6 weeks   PT Duration Other (comment)   10 weeks   PT Treatment/Interventions ADLs/Self Care Home Management;Cryotherapy;Electrical Stimulation;Iontophoresis 4mg /ml Dexamethasone;Moist Heat;Balance training;Therapeutic exercise;Therapeutic activities;Functional mobility training;DME Instruction;Neuromuscular re-education;Ultrasound;Patient/family education;Passive range of motion;Dry needling;Spinal Manipulations;Joint Manipulations;Taping;Manual techniques    PT Next Visit Plan Review, continue PROM only , manual intervention    PT Home Exercise Plan [email or web address]    Consulted and Agree with Plan  of Care Patient           Patient will benefit from skilled therapeutic intervention in order to improve the following deficits and impairments:  Decreased endurance,Decreased activity tolerance,Decreased strength,Impaired UE functional use,Pain,Decreased mobility,Decreased range of motion,Impaired perceived functional ability,Improper body mechanics,Postural dysfunction,Impaired flexibility,Decreased coordination  Visit Diagnosis: Chronic right shoulder pain  Muscle weakness (generalized)  Abnormal posture     Problem List Patient Active Problem List   Diagnosis Date Noted  .  Nontraumatic incomplete tear of right rotator cuff 08/19/2019  . AC (acromioclavicular) arthritis 08/19/2019  . Biceps tendonitis on right 08/19/2019  . Pain in right shoulder 07/08/2019  . Personal history of colonic polyps 06/09/2016  . Essential hypertension 03/08/2015  . Alcohol use 02/22/2015  . Current smoker 02/22/2015  . Hyperlipidemia LDL goal <130 02/22/2015  . Rhinitis, allergic 02/22/2015  . ADD (attention deficit disorder) 03/13/2011    Scot Jun, PT, DPT, OCS, ATC 05/05/20  9:14 AM    [facility] Physical [facility] [address] [place], [place], [address] Phone: [phone]   Fax:  [phone]  Name: ROMELL WOLDEN MRN: [id number] Date of Birth: 30-Jun-1965

## 2020-05-05 NOTE — Patient Instructions (Signed)
Access Code: [email or web address] URL: [email or web address] Date: 05/05/2020 Prepared by: Scot Jun  Exercises Circular Shoulder Pendulum with Table Support - 2-3 x daily - 7 x weekly - 1-2 sets - 10 reps Seated Scapular Retraction - 2-3 x daily - 7 x weekly - 2 sets - 10 reps - 5 hold Supine Shoulder Flexion PROM - 2-3 x daily - 7 x weekly - 1-2 sets - 10 reps - 5 hold Standing Circular Shoulder Pendulum Supported with Arm Bent - 2-3 x daily - 7 x weekly - 1-2 sets - 10 reps

## 2020-05-13 ENCOUNTER — Ambulatory Visit: Payer: BC Managed Care – PPO | Admitting: Orthopaedic Surgery

## 2020-05-13 ENCOUNTER — Other Ambulatory Visit (HOSPITAL_COMMUNITY): Payer: Self-pay

## 2020-05-13 MED FILL — Amphetamine-Dextroamphetamine Tab 10 MG: ORAL | 30 days supply | Qty: 60 | Fill #0 | Status: AC

## 2020-05-17 ENCOUNTER — Ambulatory Visit (INDEPENDENT_AMBULATORY_CARE_PROVIDER_SITE_OTHER): Payer: BC Managed Care – PPO | Admitting: Rehabilitative and Restorative Service Providers"

## 2020-05-17 ENCOUNTER — Other Ambulatory Visit: Payer: Self-pay

## 2020-05-17 ENCOUNTER — Encounter: Payer: Self-pay | Admitting: Rehabilitative and Restorative Service Providers"

## 2020-05-17 DIAGNOSIS — M6281 Muscle weakness (generalized): Secondary | ICD-10-CM

## 2020-05-17 DIAGNOSIS — R293 Abnormal posture: Secondary | ICD-10-CM | POA: Diagnosis not present

## 2020-05-17 DIAGNOSIS — M25511 Pain in right shoulder: Secondary | ICD-10-CM | POA: Diagnosis not present

## 2020-05-17 DIAGNOSIS — G8929 Other chronic pain: Secondary | ICD-10-CM

## 2020-05-17 NOTE — Therapy (Addendum)
[facility] Physical Therapy [address] [place], [place], [address] Phone: [phone]   Fax:  [phone]  Physical Therapy Treatment/Discharge  Patient Details  Name: Joshua Henry MRN: [id number] Date of Birth: 1965/04/24 Referring Provider (PT): Dr. Durward Fortes   Encounter Date: 05/17/2020   PT End of Session - 05/17/20 1102     Visit Number 2    Number of Visits 20    Date for PT Re-Evaluation 07/14/20    Progress Note Due on Visit 10    PT Start Time 1100    PT Stop Time 1129    PT Time Calculation (min) 29 min    Activity Tolerance Patient tolerated treatment well    Behavior During Therapy [facility] for tasks assessed/performed             Past Medical History:  Diagnosis Date   ADHD    Allergy    RHINITIS   H/O: rheumatic fever    Heart murmur    TRIVIAL TRICUSPPID INSUFF.   Hypertension    Smoker     Past Surgical History:  Procedure Laterality Date   COLONOSCOPY     with polyps   INGUINAL HERNIA REPAIR Left 01/21/2019   Procedure: LAPAROSCOPIC LEFT  INGUINAL HERNIA REPAIR WITH MESH;  Surgeon: Ralene Ok, MD;  Location: [place];  Service: General;  Laterality: Left;   INSERTION OF MESH Left 01/21/2019   Procedure: Insertion Of Mesh;  Surgeon: Ralene Ok, MD;  Location: [place];  Service: General;  Laterality: Left;   KNEE ARTHROSCOPY Left    torn meniscus   TONSILLECTOMY     VASECTOMY  2008    There were no vitals filed for this visit.   Subjective Assessment - 05/17/20 1103     Subjective Pt. stated no pain today upon arrival.  Pt. stated pain c getting shirt on and deodorant.    Pertinent History S/p Rt shoulder rotator cuff repair (mini open) - supraspinatus, SAD,DCR on 04/22/20    Limitations Lifting;House hold activities    Patient Stated Goals Reduce pain    Currently in Pain? No/denies    Pain Score 7    pain at worst over weekend   Pain Location Shoulder    Pain Orientation Right    Pain Descriptors /  Indicators Aching;Sharp;Sore    Pain Type Surgical pain    Pain Onset 1 to 4 weeks ago    Pain Frequency Intermittent    Aggravating Factors  putting deodorant, getting shirts on    Pain Relieving Factors rest                [facility] PT Assessment - 05/17/20 0001       Assessment   Medical Diagnosis Rt shoulder mini open rotator cuff repair (supraspinatus, SAD, DCR)    Referring Provider (PT) Dr. Durward Fortes    Onset Date/Surgical Date 04/22/20    Hand Dominance Left      PROM   Right Shoulder Flexion 130 Degrees    Right Shoulder External Rotation 60 Degrees   in 45 deg abduction in supine                          [facility] Adult PT Treatment/Exercise - 05/17/20 0001       Exercises   Exercises Shoulder      Shoulder Exercises: Supine   Other Supine Exercises passive flexion c Lt UE assist x 10 (2-3 second hold)  Shoulder Exercises: Seated   Retraction Both   5 sec hold x 10   Other Seated Exercises Rt elbow AROM flexion/extension x 10      Manual Therapy   Manual therapy comments g2 inferior jt mobs, posterior jt mobs Rt glenohumeral mobs, PROM                      PT Short Term Goals - 05/17/20 1123       PT SHORT TERM GOAL #1   Title Patient will demonstrate independent use of home exercise program to maintain progress from in clinic treatments.    Time 3    Period Weeks    Status On-going    Target Date 05/26/20               PT Long Term Goals - 05/05/20 0905       PT LONG TERM GOAL #1   Title Patient will demonstrate/report pain at worst less than or equal to 2/10 to facilitate minimal limitation in daily activity secondary to pain symptoms.    Time 10    Period Weeks    Status New    Target Date 07/14/20      PT LONG TERM GOAL #2   Title v    Time 10    Period Weeks    Status New    Target Date 07/14/20      PT LONG TERM GOAL #3   Title Patient will demonstrate return to work/recreational activity at  previous level of function without limitations secondary due to condition    Time 10    Period Weeks    Status New    Target Date 07/14/20      PT LONG TERM GOAL #4   Title Pt. will demonstrate Rt shoulder AROM WFL s symptoms to faclitate usual reaching, dressing, work  at [employer].    Time 10    Period Weeks    Status New    Target Date 07/14/20      PT LONG TERM GOAL #5   Title Pt. will demonstrate Rt arm MMT 5/5 throughout, dynamometry 85% of Lt to indicate ability to perform lifting, carrying at home and work at [employer].    Baseline Unable to measure due to protocol, will assess June    Time 10    Period Weeks    Status New    Target Date 07/14/20      Additional Long Term Goals   Additional Long Term Goals Yes      PT LONG TERM GOAL #6   Title Pt. will demonstrate FOTO outcome > or = 69% to indicate reduced disability due to condition.    Time 10    Period Weeks    Status New    Target Date 07/14/20                   Plan - 05/17/20 1123     Clinical Impression Statement Passive movement on Rt arm showed improvement and reduced symptoms but still noted at end range.  Minimal joint restriction noted in movement assessment.  Continued use of passive mobility only per MD referral at this time through 06/10/2020 per initial referral.  Good knowledge of HEP.    Personal Factors and Comorbidities Comorbidity 1    Comorbidities HTN    Examination-Activity Limitations Sleep;Carry;Toileting;Dressing;Hygiene/Grooming;Lift;Reach Overhead    Examination-Participation Restrictions Occupation;Yard Work;Laundry;Community Activity;Driving;Cleaning    Stability/Clinical Decision Making Stable/Uncomplicated    Clinical  Decision Making Low    Rehab Potential Good    PT Frequency --   1x/week 4 weeks, then 2x/week 6 weeks   PT Duration Other (comment)   10 weeks   PT Treatment/Interventions ADLs/Self Care Home Management;Cryotherapy;Electrical Stimulation;Iontophoresis 21m/ml  Dexamethasone;Moist Heat;Balance training;Therapeutic exercise;Therapeutic activities;Functional mobility training;DME Instruction;Neuromuscular re-education;Ultrasound;Patient/family education;Passive range of motion;Dry needling;Spinal Manipulations;Joint Manipulations;Taping;Manual techniques    PT Next Visit Plan Review, continue PROM only , manual intervention    PT Home Exercise Plan [email or web address]    Consulted and Agree with Plan of Care Patient             Patient will benefit from skilled therapeutic intervention in order to improve the following deficits and impairments:  Decreased endurance,Decreased activity tolerance,Decreased strength,Impaired UE functional use,Pain,Decreased mobility,Decreased range of motion,Impaired perceived functional ability,Improper body mechanics,Postural dysfunction,Impaired flexibility,Decreased coordination  Visit Diagnosis: Chronic right shoulder pain  Muscle weakness (generalized)  Abnormal posture     Problem List Patient Active Problem List   Diagnosis Date Noted   Nontraumatic incomplete tear of right rotator cuff 08/19/2019   AC (acromioclavicular) arthritis 08/19/2019   Biceps tendonitis on right 08/19/2019   Pain in right shoulder 07/08/2019   Personal history of colonic polyps 06/09/2016   Essential hypertension 03/08/2015   Alcohol use 02/22/2015   Current smoker 02/22/2015   Hyperlipidemia LDL goal <130 02/22/2015   Rhinitis, allergic 02/22/2015   ADD (attention deficit disorder) 03/13/2011   MScot Jun PT, DPT, OCS, ATC 05/17/20  11:32 AM  PHYSICAL THERAPY DISCHARGE SUMMARY  Visits from Start of Care: 2  Current functional level related to goals / functional outcomes: See note     Remaining deficits: See note   Education / Equipment: HEP    Patient agrees to discharge. Patient goals were not met. Patient is being discharged due to not returning since the last visit. MScot Jun PT, DPT, OCS, ATC 08/11/20   1:52 PM     [facility] [address] [address]Phone: [phone]  Fax:  [phone] Name: Joshua COCHRANEMRN: [id number] of Birth: [birth year]

## 2020-05-24 ENCOUNTER — Encounter: Payer: BC Managed Care – PPO | Admitting: Physical Therapy

## 2020-05-24 ENCOUNTER — Telehealth: Payer: Self-pay | Admitting: Physical Therapy

## 2020-05-24 NOTE — Telephone Encounter (Signed)
Pt no show for PT appointment today. They were contacted and informed of this. They were provided the date and time of their next appointment on voicemail. They were instructed to call us to let us know if they cannot make their appointment.  Elsie Ra, PT, DPT 05/24/20 9:37 AM

## 2020-06-07 ENCOUNTER — Encounter: Payer: BC Managed Care – PPO | Admitting: Rehabilitative and Restorative Service Providers"

## 2020-06-07 ENCOUNTER — Telehealth: Payer: Self-pay | Admitting: Rehabilitative and Restorative Service Providers"

## 2020-06-07 NOTE — Telephone Encounter (Signed)
Called and left message for Patient regarding 2nd no show in a row.  Pt. Has MD appointment next and was reminded of time as well as next physical therapy appointment time.  Will cancel any additional visits until Pt. Returns to clinic/communicates desire to attend sessions.  Scot Jun, PT, DPT, OCS, ATC 06/07/20  8:23 AM

## 2020-06-09 ENCOUNTER — Encounter: Payer: No Typology Code available for payment source | Admitting: Family Medicine

## 2020-06-09 ENCOUNTER — Encounter: Payer: BC Managed Care – PPO | Admitting: Orthopaedic Surgery

## 2020-06-14 ENCOUNTER — Encounter: Payer: BC Managed Care – PPO | Admitting: Rehabilitative and Restorative Service Providers"

## 2020-06-15 ENCOUNTER — Encounter: Payer: Self-pay | Admitting: Family Medicine

## 2020-06-15 ENCOUNTER — Encounter: Payer: No Typology Code available for payment source | Admitting: Family Medicine

## 2020-06-15 ENCOUNTER — Ambulatory Visit: Payer: BC Managed Care – PPO | Admitting: Family Medicine

## 2020-06-15 ENCOUNTER — Other Ambulatory Visit (HOSPITAL_COMMUNITY): Payer: Self-pay

## 2020-06-15 ENCOUNTER — Other Ambulatory Visit: Payer: Self-pay

## 2020-06-15 VITALS — BP 142/86 | HR 79 | Temp 96.7°F | Ht 69.0 in | Wt 185.6 lb

## 2020-06-15 DIAGNOSIS — Z9889 Other specified postprocedural states: Secondary | ICD-10-CM | POA: Insufficient documentation

## 2020-06-15 DIAGNOSIS — Z0000 Encounter for general adult medical examination without abnormal findings: Secondary | ICD-10-CM | POA: Diagnosis not present

## 2020-06-15 DIAGNOSIS — F17200 Nicotine dependence, unspecified, uncomplicated: Secondary | ICD-10-CM | POA: Diagnosis not present

## 2020-06-15 DIAGNOSIS — I10 Essential (primary) hypertension: Secondary | ICD-10-CM

## 2020-06-15 DIAGNOSIS — Z7289 Other problems related to lifestyle: Secondary | ICD-10-CM | POA: Diagnosis not present

## 2020-06-15 DIAGNOSIS — F988 Other specified behavioral and emotional disorders with onset usually occurring in childhood and adolescence: Secondary | ICD-10-CM | POA: Diagnosis not present

## 2020-06-15 DIAGNOSIS — Z1159 Encounter for screening for other viral diseases: Secondary | ICD-10-CM

## 2020-06-15 DIAGNOSIS — Z789 Other specified health status: Secondary | ICD-10-CM

## 2020-06-15 DIAGNOSIS — Z86010 Personal history of colonic polyps: Secondary | ICD-10-CM

## 2020-06-15 DIAGNOSIS — B079 Viral wart, unspecified: Secondary | ICD-10-CM

## 2020-06-15 DIAGNOSIS — E785 Hyperlipidemia, unspecified: Secondary | ICD-10-CM | POA: Diagnosis not present

## 2020-06-15 DIAGNOSIS — J309 Allergic rhinitis, unspecified: Secondary | ICD-10-CM

## 2020-06-15 MED ORDER — VARENICLINE TARTRATE 1 MG PO TABS
1.0000 mg | ORAL_TABLET | Freq: Two times a day (BID) | ORAL | 1 refills | Status: DC
  Filled 2020-06-15 – 2020-06-30 (×3): qty 60, 30d supply, fill #0
  Filled 2020-08-11: qty 60, 30d supply, fill #1

## 2020-06-15 MED ORDER — VARENICLINE TARTRATE 0.5 MG PO TABS
0.5000 mg | ORAL_TABLET | Freq: Two times a day (BID) | ORAL | 0 refills | Status: DC
  Filled 2020-06-15: qty 30, 15d supply, fill #0

## 2020-06-15 MED ORDER — AMPHETAMINE-DEXTROAMPHETAMINE 10 MG PO TABS
10.0000 mg | ORAL_TABLET | Freq: Two times a day (BID) | ORAL | 0 refills | Status: DC
  Filled 2020-06-15: qty 60, 30d supply, fill #0

## 2020-06-15 MED ORDER — VARENICLINE TARTRATE 0.5 MG X 11 & 1 MG X 42 PO MISC
ORAL | 0 refills | Status: DC
  Filled 2020-06-15: qty 53, fill #0

## 2020-06-15 MED ORDER — AMPHETAMINE-DEXTROAMPHETAMINE 10 MG PO TABS
10.0000 mg | ORAL_TABLET | Freq: Two times a day (BID) | ORAL | 0 refills | Status: DC
  Filled 2020-07-28: qty 60, 30d supply, fill #0

## 2020-06-15 MED ORDER — AMPHETAMINE-DEXTROAMPHETAMINE 10 MG PO TABS
10.0000 mg | ORAL_TABLET | Freq: Two times a day (BID) | ORAL | 0 refills | Status: DC
  Filled 2020-09-15: qty 60, 30d supply, fill #0

## 2020-06-15 MED ORDER — LOSARTAN POTASSIUM-HCTZ 50-12.5 MG PO TABS
1.0000 | ORAL_TABLET | Freq: Every day | ORAL | 3 refills | Status: DC
  Filled 2020-06-15: qty 30, 30d supply, fill #0
  Filled 2020-08-11: qty 30, 30d supply, fill #1
  Filled 2020-10-27: qty 30, 30d supply, fill #2
  Filled 2020-12-28: qty 30, 30d supply, fill #3
  Filled 2021-03-08: qty 30, 30d supply, fill #4

## 2020-06-15 NOTE — Progress Notes (Signed)
   Subjective:    Patient ID: Joshua Henry, male    DOB: 1965/11/01, 55 y.o.   MRN: [id number]  HPI He is here for complete examination.  He does have a lesion on his left shoulder he wants me look at.  He has also had recent right shoulder surgery for rotator cuff issues and is slowly getting better with that.  He does have underlying ADD and is using 10 mg of Adderall twice per day.  He gets 3 to 4 hours with the benefit out of each pill and is very happy with the results of it.  He has a history of adenomatous colonic polyp and is scheduled to be seen later this year for colonoscopy.  He is smoking again and would like to go back on Chantix.  He has done this in the past.  He started smoking again due to stress of a DUI and subsequent need to have weekend jail time.  He continues to drink greater than 2 beverages per night.  His allergies are under good control.  His work schedule is in a state of change as he is now alternating between the first and second shift which is causing some sleep issues.  His home life is going well.  Otherwise family and social history as well as health maintenance and immunizations was reviewed.   Review of Systems  All other systems reviewed and are negative.     Objective:   Physical Exam Alert and in no distress. Tympanic membranes and canals are normal. Pharyngeal area is normal. Neck is supple without adenopathy or thyromegaly. Cardiac exam shows a regular sinus rhythm without murmurs or gallops. Lungs are clear to auscultation.  Abdominal exam shows no masses or tenderness with normal bowel sounds        Assessment & Plan:  Routine general medical examination at a health care facility - Plan: CBC with Differential/Platelet, Comprehensive metabolic panel, Lipid panel  Attention deficit disorder, unspecified hyperactivity presence - Plan: amphetamine-dextroamphetamine (ADDERALL) 10 MG tablet, amphetamine-dextroamphetamine (ADDERALL) 10 MG tablet,  amphetamine-dextroamphetamine (ADDERALL) 10 MG tablet  Current smoker - Plan: DISCONTINUED: varenicline (CHANTIX STARTING MONTH PAK) 0.5 MG X 11 & 1 MG X 42 tablet  Alcohol use  Hyperlipidemia LDL goal <130  Allergic rhinitis, unspecified seasonality, unspecified trigger  Essential hypertension - Plan: losartan-hydrochlorothiazide (HYZAAR) 50-12.5 MG tablet  Personal history of colonic polyps - Plan: Ambulatory referral to Gastroenterology  History of rotator cuff surgery  Need for hepatitis C screening test - Plan: Hepatitis C antibody  Viral warts, unspecified type Discussed proper care of the wart with Compound W and how to apply it. I will start him back on Chantix at 2.5 for couple weeks and then going up to the full 1 mg per twice per day.  He does know how to use this and has done well in the past on this. Blood pressure medication was renewed. Encouraged him to continue with his shoulder rehab to increase his range of motion. Encouraged him to cut back on alcohol consumption to no more than 2 beers per day.

## 2020-06-16 ENCOUNTER — Encounter: Payer: BC Managed Care – PPO | Admitting: Rehabilitative and Restorative Service Providers"

## 2020-06-16 LAB — COMPREHENSIVE METABOLIC PANEL
ALT: 23 IU/L (ref 0–44)
AST: 21 IU/L (ref 0–40)
Albumin/Globulin Ratio: 1.8 (ref 1.2–2.2)
Albumin: 4.6 g/dL (ref 3.8–4.9)
Alkaline Phosphatase: 94 IU/L (ref 44–121)
BUN/Creatinine Ratio: 19 (ref 9–20)
BUN: 17 mg/dL (ref 6–24)
Bilirubin Total: 0.5 mg/dL (ref 0.0–1.2)
CO2: 19 mmol/L — ABNORMAL LOW (ref 20–29)
Calcium: 9.3 mg/dL (ref 8.7–10.2)
Chloride: 102 mmol/L (ref 96–106)
Creatinine, Ser: 0.88 mg/dL (ref 0.76–1.27)
Globulin, Total: 2.6 g/dL (ref 1.5–4.5)
Glucose: 92 mg/dL (ref 65–99)
Potassium: 4.3 mmol/L (ref 3.5–5.2)
Sodium: 140 mmol/L (ref 134–144)
Total Protein: 7.2 g/dL (ref 6.0–8.5)
eGFR: 102 mL/min/{1.73_m2} (ref >59)

## 2020-06-16 LAB — LIPID PANEL
Chol/HDL Ratio: 3.8 ratio (ref 0.0–5.0)
Cholesterol, Total: 260 mg/dL — ABNORMAL HIGH (ref 100–199)
HDL: 68 mg/dL (ref >39)
LDL Chol Calc (NIH): 170 mg/dL — ABNORMAL HIGH (ref 0–99)
Triglycerides: 125 mg/dL (ref 0–149)
VLDL Cholesterol Cal: 22 mg/dL (ref 5–40)

## 2020-06-16 LAB — CBC WITH DIFFERENTIAL/PLATELET
Basophils Absolute: 0.1 10*3/uL (ref 0.0–0.2)
Basos: 1 %
EOS (ABSOLUTE): 0.1 10*3/uL (ref 0.0–0.4)
Eos: 1 %
Hematocrit: 48.9 % (ref 37.5–51.0)
Hemoglobin: 17.5 g/dL (ref 13.0–17.7)
Immature Grans (Abs): 0 10*3/uL (ref 0.0–0.1)
Immature Granulocytes: 0 %
Lymphocytes Absolute: 2.4 10*3/uL (ref 0.7–3.1)
Lymphs: 24 %
MCH: 33.1 pg — ABNORMAL HIGH (ref 26.6–33.0)
MCHC: 35.8 g/dL — ABNORMAL HIGH (ref 31.5–35.7)
MCV: 92 fL (ref 79–97)
Monocytes Absolute: 0.7 10*3/uL (ref 0.1–0.9)
Monocytes: 7 %
Neutrophils Absolute: 6.8 10*3/uL (ref 1.4–7.0)
Neutrophils: 67 %
Platelets: 193 10*3/uL (ref 150–450)
RBC: 5.29 x10E6/uL (ref 4.14–5.80)
RDW: 12.8 % (ref 11.6–15.4)
WBC: 10.1 10*3/uL (ref 3.4–10.8)

## 2020-06-16 LAB — HEPATITIS C ANTIBODY: Hep C Virus Ab: <0.1 s/co ratio (ref 0.0–0.9)

## 2020-06-21 ENCOUNTER — Encounter: Payer: BC Managed Care – PPO | Admitting: Physical Therapy

## 2020-06-23 ENCOUNTER — Encounter: Payer: BC Managed Care – PPO | Admitting: Rehabilitative and Restorative Service Providers"

## 2020-06-28 ENCOUNTER — Encounter: Payer: BC Managed Care – PPO | Admitting: Rehabilitative and Restorative Service Providers"

## 2020-06-30 ENCOUNTER — Encounter: Payer: BC Managed Care – PPO | Admitting: Rehabilitative and Restorative Service Providers"

## 2020-06-30 ENCOUNTER — Other Ambulatory Visit (HOSPITAL_COMMUNITY): Payer: Self-pay

## 2020-07-01 ENCOUNTER — Other Ambulatory Visit (HOSPITAL_COMMUNITY): Payer: Self-pay

## 2020-07-28 ENCOUNTER — Other Ambulatory Visit (HOSPITAL_COMMUNITY): Payer: Self-pay

## 2020-08-12 ENCOUNTER — Other Ambulatory Visit (HOSPITAL_COMMUNITY): Payer: Self-pay

## 2020-08-23 ENCOUNTER — Other Ambulatory Visit (HOSPITAL_COMMUNITY): Payer: Self-pay

## 2020-08-23 ENCOUNTER — Ambulatory Visit (AMBULATORY_SURGERY_CENTER): Payer: BC Managed Care – PPO

## 2020-08-23 VITALS — Ht 69.0 in | Wt 180.0 lb

## 2020-08-23 DIAGNOSIS — Z86010 Personal history of colonic polyps: Secondary | ICD-10-CM

## 2020-08-23 MED ORDER — PEG-KCL-NACL-NASULF-NA ASC-C 100 G PO SOLR
1.0000 | Freq: Once | ORAL | 0 refills | Status: AC
  Filled 2020-08-23 – 2020-09-27 (×2): qty 1, 1d supply, fill #0

## 2020-08-23 NOTE — Progress Notes (Signed)

## 2020-08-24 ENCOUNTER — Other Ambulatory Visit (HOSPITAL_COMMUNITY): Payer: Self-pay

## 2020-09-01 ENCOUNTER — Other Ambulatory Visit (HOSPITAL_COMMUNITY): Payer: Self-pay

## 2020-09-15 ENCOUNTER — Other Ambulatory Visit (HOSPITAL_COMMUNITY): Payer: Self-pay

## 2020-09-23 ENCOUNTER — Encounter: Payer: Self-pay | Admitting: Gastroenterology

## 2020-09-27 ENCOUNTER — Other Ambulatory Visit (HOSPITAL_COMMUNITY): Payer: Self-pay

## 2020-10-05 ENCOUNTER — Encounter: Payer: Self-pay | Admitting: Gastroenterology

## 2020-10-05 ENCOUNTER — Ambulatory Visit (AMBULATORY_SURGERY_CENTER): Payer: BC Managed Care – PPO | Admitting: Gastroenterology

## 2020-10-05 ENCOUNTER — Other Ambulatory Visit: Payer: Self-pay

## 2020-10-05 VITALS — BP 155/62 | HR 54 | Temp 98.2°F | Resp 17 | Ht 69.0 in | Wt 180.0 lb

## 2020-10-05 DIAGNOSIS — Z86010 Personal history of colonic polyps: Secondary | ICD-10-CM | POA: Diagnosis not present

## 2020-10-05 MED ORDER — SODIUM CHLORIDE 0.9 % IV SOLN
500.0000 mL | Freq: Once | INTRAVENOUS | Status: DC
Start: 2020-10-05 — End: 2020-10-05

## 2020-10-05 NOTE — Op Note (Signed)
[facility] Patient Name: Joshua Henry Procedure Date: 10/05/2020 3:03 PM MRN: [id number] Endoscopist: Mallie Mussel L. Loletha Carrow , MD Age: 55 Referring MD:  Date of Birth: 12/26/1965 Gender: Male Account #: [id number] Procedure:                Colonoscopy Indications:              Surveillance: Personal history of adenomatous                            polyps on last colonoscopy 5 years ago                           (<75mm TA 10/2015) Medicines:                Monitored Anesthesia Care Procedure:                Pre-Anesthesia Assessment:                           - Prior to the procedure, a History and Physical                            was performed, and patient medications and                            allergies were reviewed. The patient's tolerance of                            previous anesthesia was also reviewed. The risks                            and benefits of the procedure and the sedation                            options and risks were discussed with the patient.                            All questions were answered, and informed consent                            was obtained. Prior Anticoagulants: The patient has                            taken no previous anticoagulant or antiplatelet                            agents. ASA Grade Assessment: II - A patient with                            mild systemic disease. After reviewing the risks                            and benefits, the patient was deemed in  satisfactory condition to undergo the procedure.                           After obtaining informed consent, the colonoscope                            was passed under direct vision. Throughout the                            procedure, the patient's blood pressure, pulse, and                            oxygen saturations were monitored continuously. The                            Olympus CF-HQ190L [id number]) Colonoscope was                             introduced through the anus and advanced to the the                            cecum, identified by appendiceal orifice and                            ileocecal valve. The colonoscopy was performed                            without difficulty. The patient tolerated the                            procedure well. The quality of the bowel                            preparation was excellent. The ileocecal valve,                            appendiceal orifice, and rectum were photographed. Scope In: 3:17:03 PM Scope Out: 3:33:37 PM Scope Withdrawal Time: 0 hours 11 minutes 16 seconds  Total Procedure Duration: 0 hours 16 minutes 34 seconds  Findings:                 The perianal and digital rectal examinations were                            normal.                           The entire examined colon appeared normal on direct                            and retroflexion views. Complications:            No immediate complications. Estimated Blood Loss:     Estimated blood loss: none. Impression:               - The entire examined colon is normal on  direct and                            retroflexion views.                           - No specimens collected. Recommendation:           - Patient has a contact number available for                            emergencies. The signs and symptoms of potential                            delayed complications were discussed with the                            patient. Return to normal activities tomorrow.                            Written discharge instructions were provided to the                            patient.                           - Resume previous diet.                           - Continue present medications.                           - Repeat colonoscopy in 10 years for screening                            purposes. Kerim Statzer L. Loletha Carrow, MD 10/05/2020 3:36:27 PM This report has been signed electronically.

## 2020-10-05 NOTE — Progress Notes (Signed)
PT taken to PACU. Monitors in place. VSS. Report given to RN. 

## 2020-10-05 NOTE — Progress Notes (Signed)
Vitals by DT.  Pt's states no medical or surgical changes since previsit or office visit.

## 2020-10-05 NOTE — Progress Notes (Signed)
History and Physical:  This patient presents for endoscopic testing for: Encounter Diagnosis  Name Primary?   Personal history of colonic polyps Yes    Patient denies chest pain or dyspnea. Hx colon polyp 2017    Past Medical History: Past Medical History:  Diagnosis Date   ADHD    Allergy    RHINITIS   H/O: rheumatic fever    Heart murmur    TRIVIAL TRICUSPPID INSUFF.   Hypertension    Smoker      Past Surgical History: Past Surgical History:  Procedure Laterality Date   COLONOSCOPY     with polyps   INGUINAL HERNIA REPAIR Left 01/21/2019   Procedure: LAPAROSCOPIC LEFT  INGUINAL HERNIA REPAIR WITH MESH;  Surgeon: Ralene Ok, MD;  Location: [facility];  Service: General;  Laterality: Left;   INSERTION OF MESH Left 01/21/2019   Procedure: Insertion Of Mesh;  Surgeon: Ralene Ok, MD;  Location: [facility];  Service: General;  Laterality: Left;   KNEE ARTHROSCOPY Left    torn meniscus   ROTATOR CUFF REPAIR  04/22/2020   injury   TONSILLECTOMY     VASECTOMY  2008    Allergies: No Known Allergies  Outpatient Meds: Current Outpatient Medications  Medication Sig Dispense Refill   amphetamine-dextroamphetamine (ADDERALL) 10 MG tablet Take 1 tablet (10 mg total) by mouth 2 (two) times daily. 60 tablet 0   amphetamine-dextroamphetamine (ADDERALL) 10 MG tablet Take 1 tablet (10 mg total) by mouth 2 (two) times daily. 60 tablet 0   amphetamine-dextroamphetamine (ADDERALL) 10 MG tablet Take 1 tablet (10 mg total) by mouth 2 (two) times daily. 60 tablet 0   losartan-hydrochlorothiazide (HYZAAR) 50-12.5 MG tablet Take 1 tablet by mouth daily. 90 tablet 3   varenicline (CHANTIX CONTINUING MONTH PAK) 1 MG tablet Take 1 tablet (1 mg total) by mouth 2 (two) times daily. 60 tablet 1   varenicline (CHANTIX) 0.5 MG tablet Take 1 tablet (0.5 mg total) by mouth 2 (two) times daily. 30 tablet 0   Current Facility-Administered Medications  Medication Dose Route Frequency Provider  Last Rate Last Admin   0.9 %  sodium chloride infusion  500 mL Intravenous Once Danis, Estill Cotta III, MD       oxyCODONE (Oxy IR/ROXICODONE) immediate release tablet 5 mg  5 mg Oral Q4H PRN Petrarca, Mike Craze, PA-C          ___________________________________________________________________ Objective   Exam:  BP (!) 147/84   Pulse (!) 59   Temp 98.2 F (36.8 C)   Resp 13   Ht 5\' 9"  (1.753 m)   Wt 180 lb (81.6 kg)   SpO2 97%   BMI 26.58 kg/m   CV: RRR without murmur, S1/S2 Resp: clear to auscultation bilaterally, normal RR and effort noted GI: soft, no tenderness, with active bowel sounds.   Assessment: Encounter Diagnosis  Name Primary?   Personal history of colonic polyps Yes     Plan: Colonoscopy  The benefits and risks of the planned procedure were described in detail with the patient or (when appropriate) their health care proxy.  Risks were outlined as including, but not limited to, bleeding, infection, perforation, adverse medication reaction leading to cardiac or pulmonary decompensation, pancreatitis (if ERCP).  The limitation of incomplete mucosal visualization was also discussed.  No guarantees or warranties were given.    The patient is appropriate for an endoscopic procedure in the ambulatory setting.   - Wilfrid Lund, MD

## 2020-10-05 NOTE — Patient Instructions (Signed)
Read all of the handouts given to you by your recovery room nurse  YOU HAD AN ENDOSCOPIC PROCEDURE TODAY AT [facility]:   Refer to the procedure report that was given to you for any specific questions about what was found during the examination.  If the procedure report does not answer your questions, please call your gastroenterologist to clarify.  If you requested that your care partner not be given the details of your procedure findings, then the procedure report has been included in a sealed envelope for you to review at your convenience later.  YOU SHOULD EXPECT: Some feelings of bloating in the abdomen. Passage of more gas than usual.  Walking can help get rid of the air that was put into your GI tract during the procedure and reduce the bloating. If you had a lower endoscopy (such as a colonoscopy or flexible sigmoidoscopy) you may notice spotting of blood in your stool or on the toilet paper. If you underwent a bowel prep for your procedure, you may not have a normal bowel movement for a few days.  Please Note:  You might notice some irritation and congestion in your nose or some drainage.  This is from the oxygen used during your procedure.  There is no need for concern and it should clear up in a day or so.  SYMPTOMS TO REPORT IMMEDIATELY:  Following lower endoscopy (colonoscopy or flexible sigmoidoscopy):  Excessive amounts of blood in the stool  Significant tenderness or worsening of abdominal pains  Swelling of the abdomen that is new, acute  Fever of 100F or higher    For urgent or emergent issues, a gastroenterologist can be reached at any hour by calling [phone]. Do not use MyChart messaging for urgent concerns.    DIET:  We do recommend a small meal at first, but then you may proceed to your regular diet.  Drink plenty of fluids but you should avoid alcoholic beverages for 24 hours.  ACTIVITY:  You should plan to take it easy for the rest of today and  you should NOT DRIVE or use heavy machinery until tomorrow (because of the sedation medicines used during the test).    FOLLOW UP: Our staff will call the number listed on your records 48-72 hours following your procedure to check on you and address any questions or concerns that you may have regarding the information given to you following your procedure. If we do not reach you, we will leave a message.  We will attempt to reach you two times.  During this call, we will ask if you have developed any symptoms of COVID 19. If you develop any symptoms (ie: fever, flu-like symptoms, shortness of breath, cough etc.) before then, please call [phone].  If you test positive for Covid 19 in the 2 weeks post procedure, please call and report this information to [place].     SIGNATURES/CONFIDENTIALITY: You and/or your care partner have signed paperwork which will be entered into your electronic medical record.  These signatures attest to the fact that that the information above on your After Visit Summary has been reviewed and is understood.  Full responsibility of the confidentiality of this discharge information lies with you and/or your care-partner.

## 2020-10-07 ENCOUNTER — Telehealth: Payer: Self-pay | Admitting: *Deleted

## 2020-10-07 NOTE — Telephone Encounter (Signed)
  Follow up Call-  Call back number 10/05/2020  Post procedure Call Back phone  # [phone]  Permission to leave phone message Yes  Some recent data might be hidden     Patient questions:  Do you have a fever, pain , or abdominal swelling? No. Pain Score  0 *  Have you tolerated food without any problems? Yes.    Have you been able to return to your normal activities? Yes.    Do you have any questions about your discharge instructions: Diet   No. Medications  No. Follow up visit  No.  Do you have questions or concerns about your Care? No.  Actions: * If pain score is 4 or above: No action needed, pain <4.  Have you developed a fever since your procedure? no  2.   Have you had an respiratory symptoms (SOB or cough) since your procedure? no  3.   Have you tested positive for COVID 19 since your procedure no  4.   Have you had any family members/close contacts diagnosed with the COVID 19 since your procedure?  no   If yes to any of these questions please route to Joylene John, RN and Joella Prince, RN

## 2020-10-27 ENCOUNTER — Other Ambulatory Visit (HOSPITAL_COMMUNITY): Payer: Self-pay

## 2020-10-27 ENCOUNTER — Other Ambulatory Visit: Payer: Self-pay | Admitting: Family Medicine

## 2020-10-27 DIAGNOSIS — F988 Other specified behavioral and emotional disorders with onset usually occurring in childhood and adolescence: Secondary | ICD-10-CM

## 2020-10-27 MED ORDER — AMPHETAMINE-DEXTROAMPHETAMINE 10 MG PO TABS
10.0000 mg | ORAL_TABLET | Freq: Two times a day (BID) | ORAL | 0 refills | Status: DC
Start: 2020-12-27 — End: 2021-03-08
  Filled 2020-12-28: qty 60, 30d supply, fill #0

## 2020-10-27 MED ORDER — AMPHETAMINE-DEXTROAMPHETAMINE 10 MG PO TABS
10.0000 mg | ORAL_TABLET | Freq: Two times a day (BID) | ORAL | 0 refills | Status: DC
  Filled 2020-10-27: qty 60, 30d supply, fill #0

## 2020-10-27 MED ORDER — AMPHETAMINE-DEXTROAMPHETAMINE 10 MG PO TABS
10.0000 mg | ORAL_TABLET | Freq: Two times a day (BID) | ORAL | 0 refills | Status: DC
Start: 2020-11-27 — End: 2021-05-13
  Filled 2021-01-31: qty 60, 30d supply, fill #0

## 2020-10-27 NOTE — Telephone Encounter (Signed)
[facility] is requesting to fill pt adderall. Please advise [facility] 

## 2020-10-28 ENCOUNTER — Other Ambulatory Visit (HOSPITAL_COMMUNITY): Payer: Self-pay

## 2020-11-01 ENCOUNTER — Other Ambulatory Visit (HOSPITAL_COMMUNITY): Payer: Self-pay

## 2020-12-21 IMAGING — MR MR SHOULDER*R* W/O CM
4 of 5 series · 15 of 40 positions shown · non-contrast
Comparison: X-ray 06/03/2019

CLINICAL DATA: Chronic lateral right shoulder pain.

EXAM:
MRI OF THE RIGHT SHOULDER WITHOUT CONTRAST
TECHNIQUE: Multiplanar, multisequence MR imaging of the shoulder was performed.
No intravenous contrast was administered.

[Series 5: PD fat-sat · axial · right · 4.0mm · 0.22mm/px · z∈[-35,+42]mm · 6 of 20 slices shown (1 of 2)]
[im 1/20]
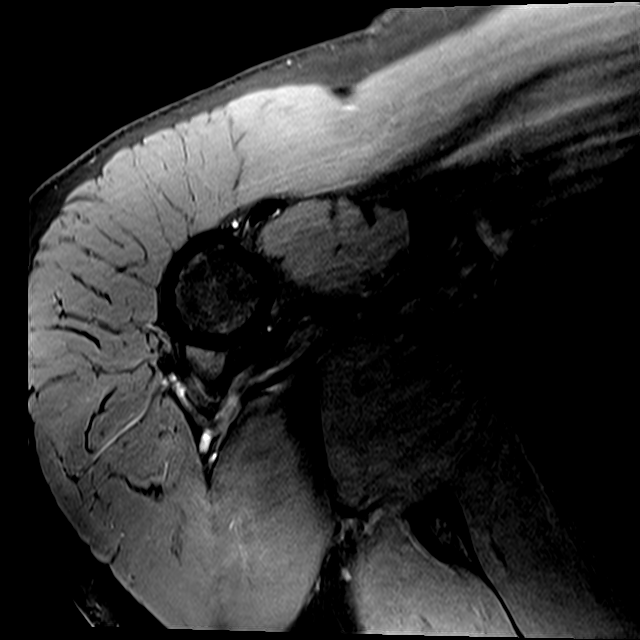
[im 3/20]
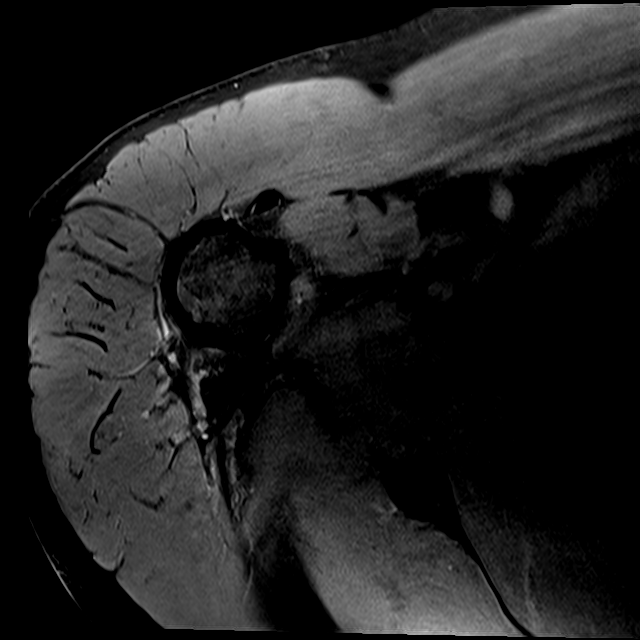
[im 7/20]
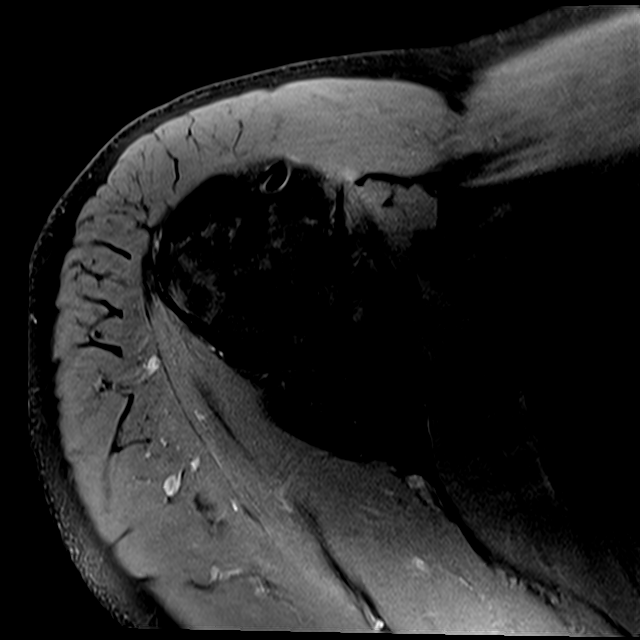
[im 9/20]
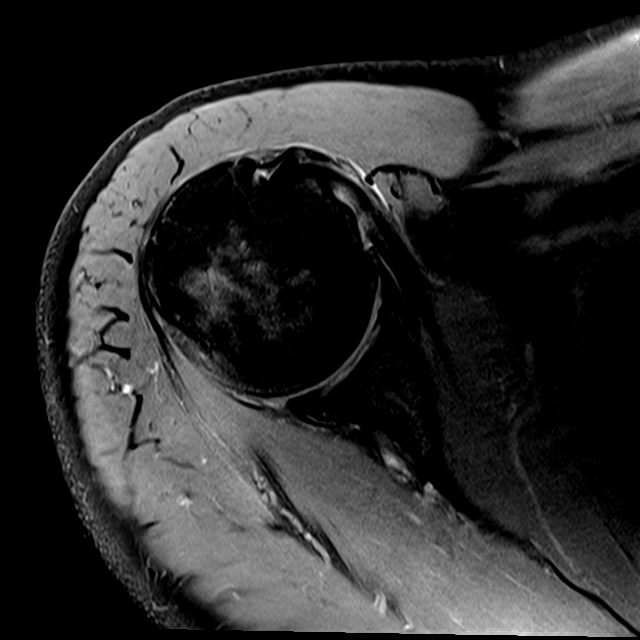
[im 11/20]
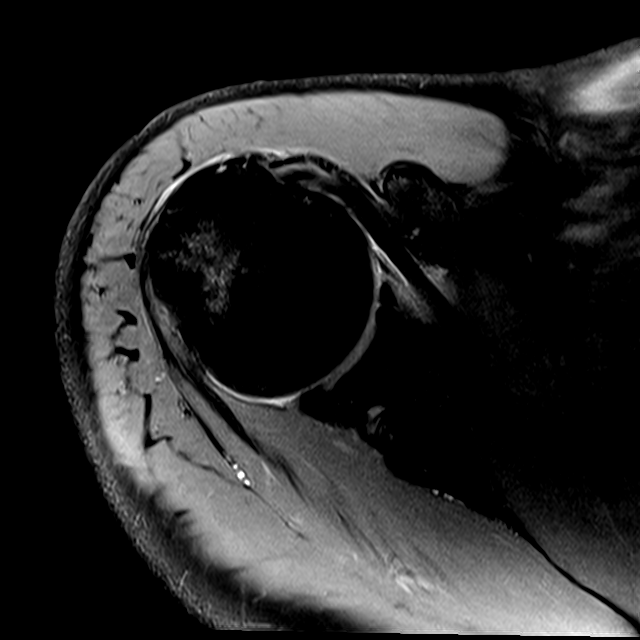
[im 17/20]
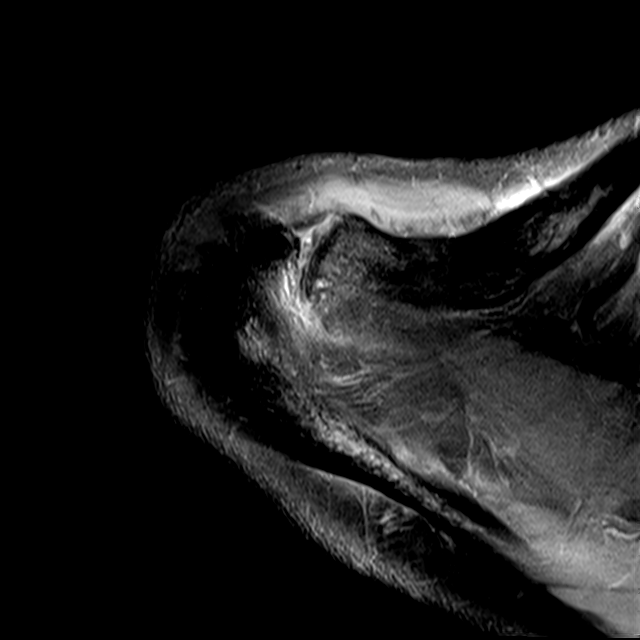

[Series 6: T2 fat-sat · oblique · right · 4.0mm · 0.22mm/px · 3 of 17 slices shown (1 of 2)]
[im 3/17]
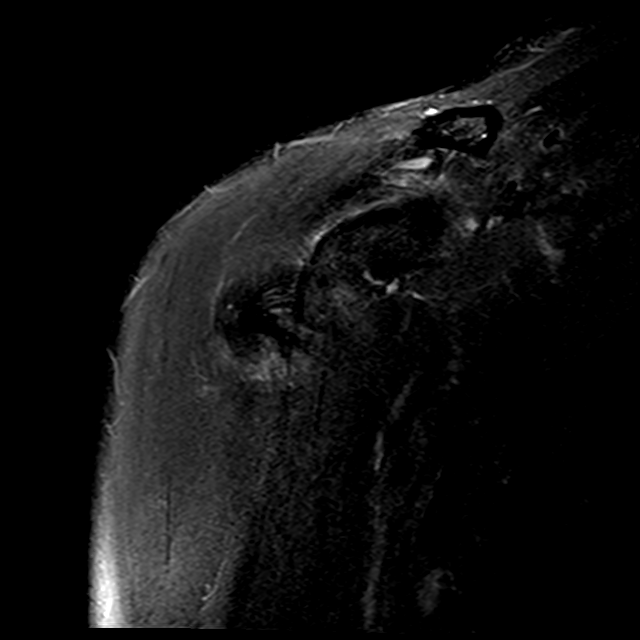
[im 9/17]
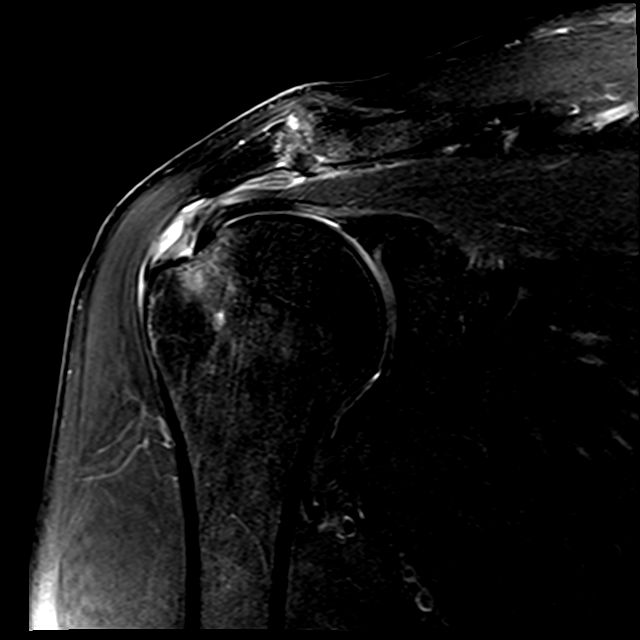
[im 14/17]
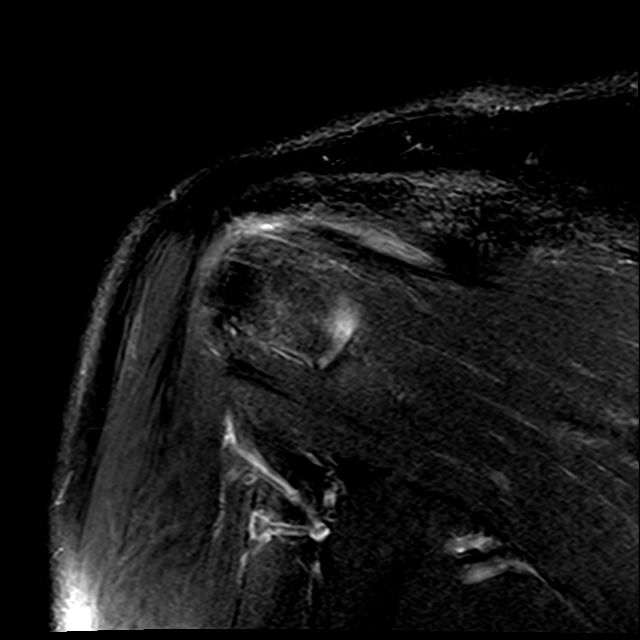

[Series 7: PD fat-sat · oblique · right · 4.0mm · 0.22mm/px · 3 of 17 slices shown (2 of 2)]
[im 3/17]
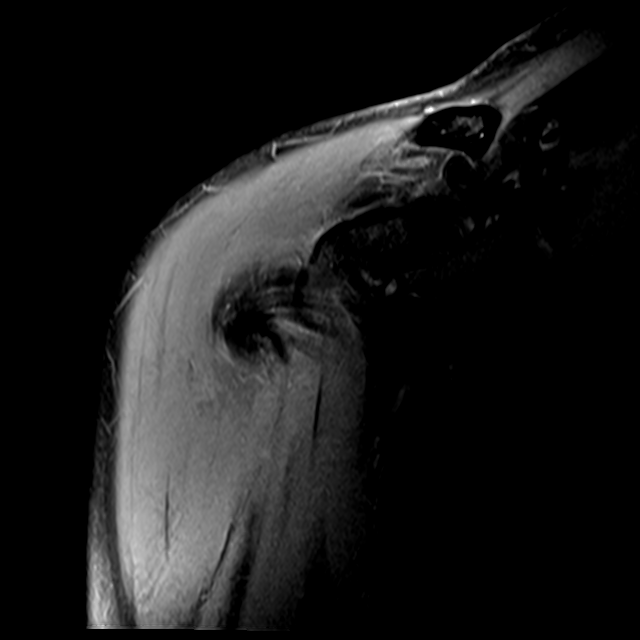
[im 9/17]
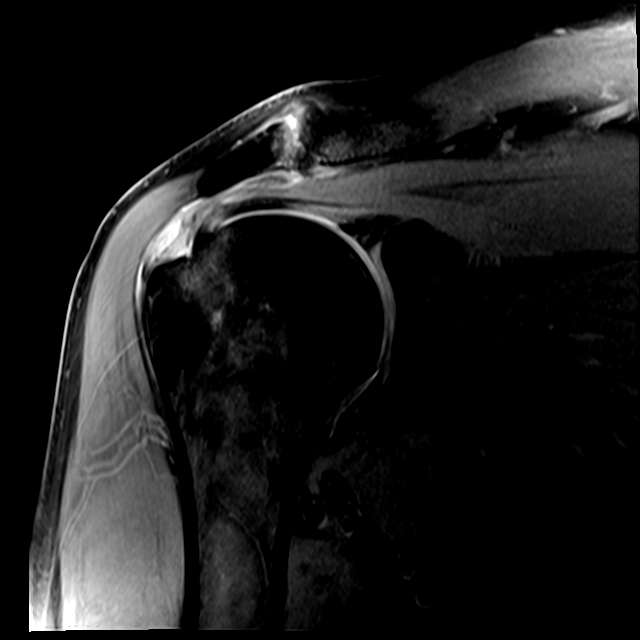
[im 14/17]
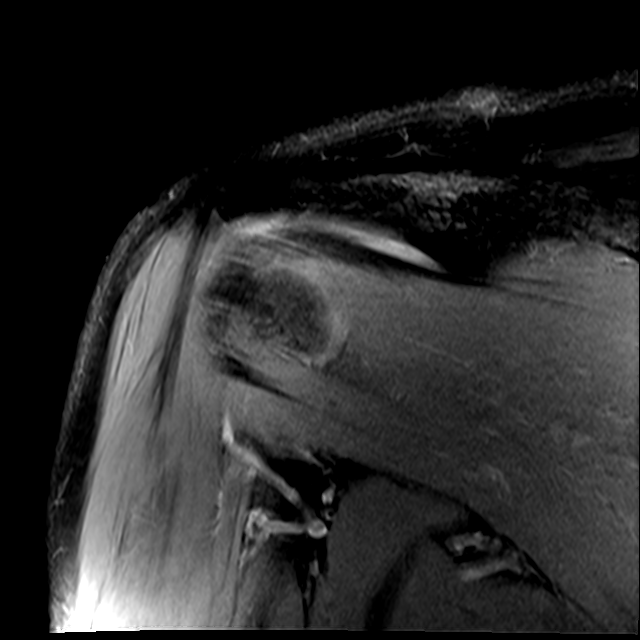

[Series 8: T2 fat-sat · oblique · right · 4.0mm · 0.44mm/px · 3 of 19 slices shown (2 of 2)]
[im 3/19]
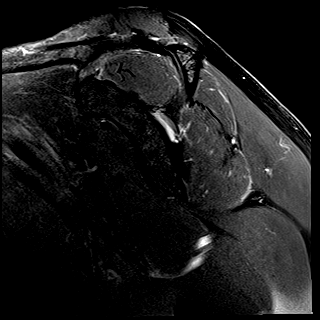
[im 11/19]
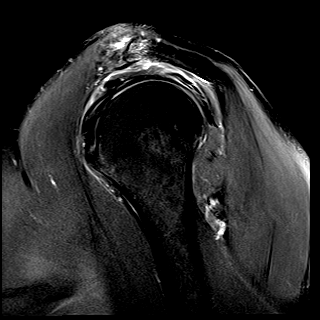
[im 16/19]
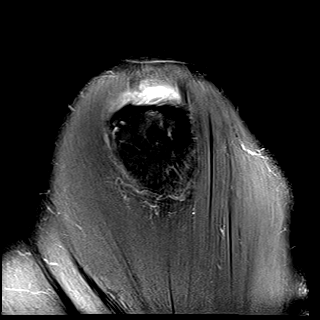

[15 of 40 positions shown; findings below may reference images not displayed]

FINDINGS: Rotator cuff: High-grade bursal sided tear of the supraspinatus
tendon and anterior infraspinatus tendon with involvement of up to
75% of the tendon depth (series 6, images 7-11). There is retraction
of the bursal tendon fibers of up to 11 mm. Tear measures
approximately 14 mm in AP dimension. No full-thickness component.
There is background of moderate tendinosis. Mild subscapularis
tendinosis without tear. Intact teres minor.

Muscles: Mild supraspinatus and infraspinatus intramuscular edema.
No atrophy or fatty infiltration.

Biceps long head: Intra-articular biceps tendinosis. There is an
accessory tendon slip or long segment longitudinal split tear of the
long head biceps tendon.

Acromioclavicular Joint: Moderate arthropathy of the
acromioclavicular joint. Mild subacromial-subdeltoid bursitis.

Glenohumeral Joint: No joint effusion. No chondral defect.

Labrum: Grossly intact, but evaluation is limited by lack of
intraarticular fluid.

Bones: Subcortical marrow edema at the greater tuberosity. No acute
fracture. No dislocation. No suspicious bone lesion.

Other: None.
IMPRESSION: 1. High-grade bursal sided tear of the supraspinatus tendon and
anterior infraspinatus tendon with involvement of up to 75% of the
tendon depth. No full-thickness component.
2. Intra-articular biceps tendinosis. There is an accessory tendon
slip or long segment longitudinal split tear of the long head biceps
tendon.
3. Moderate acromioclavicular arthropathy with mild
subacromial-subdeltoid bursitis.

## 2020-12-28 ENCOUNTER — Other Ambulatory Visit (HOSPITAL_COMMUNITY): Payer: Self-pay

## 2021-01-22 ENCOUNTER — Telehealth: Payer: Self-pay

## 2021-01-22 NOTE — Telephone Encounter (Signed)
P.A. AMPHETAMINE-DEX 10MG  completed states approved til 01/22/2022

## 2021-01-31 ENCOUNTER — Other Ambulatory Visit (HOSPITAL_COMMUNITY): Payer: Self-pay

## 2021-02-09 ENCOUNTER — Ambulatory Visit: Payer: BC Managed Care – PPO | Admitting: Physician Assistant

## 2021-03-08 ENCOUNTER — Telehealth: Payer: Self-pay | Admitting: Family Medicine

## 2021-03-08 ENCOUNTER — Other Ambulatory Visit: Payer: Self-pay | Admitting: Medical

## 2021-03-08 ENCOUNTER — Other Ambulatory Visit (HOSPITAL_COMMUNITY): Payer: Self-pay

## 2021-03-08 ENCOUNTER — Other Ambulatory Visit: Payer: Self-pay | Admitting: Family Medicine

## 2021-03-08 DIAGNOSIS — F988 Other specified behavioral and emotional disorders with onset usually occurring in childhood and adolescence: Secondary | ICD-10-CM

## 2021-03-08 DIAGNOSIS — I10 Essential (primary) hypertension: Secondary | ICD-10-CM

## 2021-03-08 MED ORDER — LOSARTAN POTASSIUM-HCTZ 50-12.5 MG PO TABS
1.0000 | ORAL_TABLET | Freq: Every day | ORAL | 0 refills | Status: DC
  Filled 2021-03-08: qty 90, 90d supply, fill #0
  Filled 2021-05-12: qty 30, 30d supply, fill #0

## 2021-03-08 MED ORDER — AMPHETAMINE-DEXTROAMPHETAMINE 10 MG PO TABS
10.0000 mg | ORAL_TABLET | Freq: Two times a day (BID) | ORAL | 0 refills | Status: DC
  Filled 2021-03-08: qty 60, 30d supply, fill #0

## 2021-03-08 NOTE — Telephone Encounter (Signed)
[facility] is requesting to fill pt adderall. Please advise [facility] 

## 2021-03-08 NOTE — Telephone Encounter (Signed)
Pt has a cpe 06/20/2021 ?

## 2021-03-08 NOTE — Telephone Encounter (Signed)
Pt called and is requesting a refill on his adderall and his hyzaar please send to the [facility] he said that was closer to him  ?

## 2021-03-14 ENCOUNTER — Other Ambulatory Visit (HOSPITAL_COMMUNITY): Payer: Self-pay

## 2021-04-04 ENCOUNTER — Ambulatory Visit: Payer: BC Managed Care – PPO | Admitting: Family Medicine

## 2021-04-04 ENCOUNTER — Encounter: Payer: Self-pay | Admitting: Family Medicine

## 2021-04-04 VITALS — BP 136/84 | HR 67 | Temp 97.9°F | Wt 204.4 lb

## 2021-04-04 DIAGNOSIS — F17200 Nicotine dependence, unspecified, uncomplicated: Secondary | ICD-10-CM | POA: Diagnosis not present

## 2021-04-04 DIAGNOSIS — R0602 Shortness of breath: Secondary | ICD-10-CM | POA: Diagnosis not present

## 2021-04-04 DIAGNOSIS — S81812A Laceration without foreign body, left lower leg, initial encounter: Secondary | ICD-10-CM

## 2021-04-04 NOTE — Progress Notes (Signed)
? ?  Subjective:  ? ? Patient ID: Joshua Henry, male    DOB: [birth year], 56 y.o.   MRN: [id number] ? ?HPI ?He states that approximately 10 days ago he sustained a horizontal laceration to the left mid calf area.  About 3 days ago he noted some redness and swelling proximal to this but states today the swelling has gotten much better. ?Towards the end of the encounter he then mentioned difficulty with shortness of breath but states that it usually occurs when he is sleeping, he can sit up cough once or twice and able to lay back down and not had any further difficulty.  He has no DOE, chest pain or diaphoresis.  No reflux type symptoms that he is aware of. ?He smokes and shows interest in quitting. ? ?Review of Systems ? ?   ?Objective:  ? Physical Exam ?Exam of the left lower leg shows a 3 inch horizontal healing lesion with some proximal erythema but it is not hot or tender. ? ? ? ?   ?Assessment & Plan:  ?Laceration of left lower extremity, initial encounter ? ?Current smoker ? ?Shortness of breath ?A photo was taken of the leg.  He is to keep track on his and if he notes worsening erythema warmth and tenderness, he will call me and I will place him on an antibiotic.  I explained that at this point it did not appear to be infected. ?I then discussed the shortness of breath explaining that at this point, shortness of breath that would go away with coughing and then able to go back to sleep speaks against underlying lung or even cardiac issues.  Recommend he keep better track of the symptoms in regard to when they occur and potentially even whether food is related to this.  With his next appointment, we can readdress this and potentially work on the smoking cessation ? ?

## 2021-04-08 ENCOUNTER — Telehealth: Payer: Self-pay

## 2021-04-08 ENCOUNTER — Other Ambulatory Visit (HOSPITAL_COMMUNITY): Payer: Self-pay

## 2021-04-08 MED ORDER — DOXYCYCLINE HYCLATE 100 MG PO TABS
100.0000 mg | ORAL_TABLET | Freq: Two times a day (BID) | ORAL | 0 refills | Status: DC
  Filled 2021-04-08: qty 20, 10d supply, fill #0

## 2021-04-08 NOTE — Telephone Encounter (Signed)
Pt states area on his leg that you saw him for has gotten worse, swelling worse, has heat & pain, would like antibiotic called into [facility] pharmacy

## 2021-04-08 NOTE — Telephone Encounter (Signed)
Called wife and informed

## 2021-05-12 ENCOUNTER — Telehealth: Payer: Self-pay

## 2021-05-12 ENCOUNTER — Other Ambulatory Visit: Payer: Self-pay | Admitting: Family Medicine

## 2021-05-12 ENCOUNTER — Other Ambulatory Visit (HOSPITAL_COMMUNITY): Payer: Self-pay

## 2021-05-12 ENCOUNTER — Other Ambulatory Visit: Payer: Self-pay | Admitting: Medical

## 2021-05-12 DIAGNOSIS — F988 Other specified behavioral and emotional disorders with onset usually occurring in childhood and adolescence: Secondary | ICD-10-CM

## 2021-05-12 DIAGNOSIS — I10 Essential (primary) hypertension: Secondary | ICD-10-CM

## 2021-05-12 MED ORDER — AMPHETAMINE-DEXTROAMPHETAMINE 10 MG PO TABS
10.0000 mg | ORAL_TABLET | Freq: Two times a day (BID) | ORAL | 0 refills | Status: DC
  Filled 2021-05-12: qty 60, 30d supply, fill #0

## 2021-05-12 MED ORDER — LOSARTAN POTASSIUM-HCTZ 50-12.5 MG PO TABS
1.0000 | ORAL_TABLET | Freq: Every day | ORAL | 0 refills | Status: DC
  Filled 2021-05-12 – 2021-05-13 (×2): qty 30, 30d supply, fill #0

## 2021-05-12 NOTE — Telephone Encounter (Signed)
Pt. Called for a refill on his Adderall and his losartan next apt is 06/20/21. ?

## 2021-05-12 NOTE — Telephone Encounter (Signed)
[facility] is requesting to fill pt  adderall. Please advise [facility] ?

## 2021-05-13 ENCOUNTER — Other Ambulatory Visit (HOSPITAL_COMMUNITY): Payer: Self-pay

## 2021-05-13 ENCOUNTER — Other Ambulatory Visit: Payer: Self-pay | Admitting: Family Medicine

## 2021-05-13 DIAGNOSIS — F988 Other specified behavioral and emotional disorders with onset usually occurring in childhood and adolescence: Secondary | ICD-10-CM

## 2021-05-13 MED ORDER — AMPHETAMINE-DEXTROAMPHETAMINE 20 MG PO TABS
10.0000 mg | ORAL_TABLET | Freq: Two times a day (BID) | ORAL | 0 refills | Status: DC
  Filled 2021-05-13: qty 30, 30d supply, fill #0

## 2021-05-13 NOTE — Telephone Encounter (Signed)
Pharmacy is requesting to change dose due to back order of original strength. Please advise [facility]  ?

## 2021-05-14 ENCOUNTER — Other Ambulatory Visit (HOSPITAL_COMMUNITY): Payer: Self-pay

## 2021-05-16 ENCOUNTER — Other Ambulatory Visit (HOSPITAL_COMMUNITY): Payer: Self-pay

## 2021-05-20 ENCOUNTER — Other Ambulatory Visit (HOSPITAL_COMMUNITY): Payer: Self-pay

## 2021-05-20 MED ORDER — AMPHETAMINE-DEXTROAMPHETAMINE 20 MG PO TABS
10.0000 mg | ORAL_TABLET | Freq: Two times a day (BID) | ORAL | 0 refills | Status: DC
  Filled 2021-05-20 – 2021-06-17 (×2): qty 30, 30d supply, fill #0

## 2021-06-17 ENCOUNTER — Other Ambulatory Visit (HOSPITAL_COMMUNITY): Payer: Self-pay

## 2021-06-20 ENCOUNTER — Ambulatory Visit (INDEPENDENT_AMBULATORY_CARE_PROVIDER_SITE_OTHER): Payer: BC Managed Care – PPO | Admitting: Family Medicine

## 2021-06-20 ENCOUNTER — Other Ambulatory Visit (HOSPITAL_COMMUNITY): Payer: Self-pay

## 2021-06-20 ENCOUNTER — Encounter: Payer: Self-pay | Admitting: Family Medicine

## 2021-06-20 VITALS — BP 140/86 | HR 70 | Temp 97.5°F | Ht 70.0 in | Wt 199.4 lb

## 2021-06-20 DIAGNOSIS — Z789 Other specified health status: Secondary | ICD-10-CM

## 2021-06-20 DIAGNOSIS — I10 Essential (primary) hypertension: Secondary | ICD-10-CM

## 2021-06-20 DIAGNOSIS — F988 Other specified behavioral and emotional disorders with onset usually occurring in childhood and adolescence: Secondary | ICD-10-CM

## 2021-06-20 DIAGNOSIS — J309 Allergic rhinitis, unspecified: Secondary | ICD-10-CM | POA: Diagnosis not present

## 2021-06-20 DIAGNOSIS — F17200 Nicotine dependence, unspecified, uncomplicated: Secondary | ICD-10-CM

## 2021-06-20 DIAGNOSIS — E785 Hyperlipidemia, unspecified: Secondary | ICD-10-CM

## 2021-06-20 DIAGNOSIS — Z86010 Personal history of colonic polyps: Secondary | ICD-10-CM

## 2021-06-20 DIAGNOSIS — Z0000 Encounter for general adult medical examination without abnormal findings: Secondary | ICD-10-CM | POA: Diagnosis not present

## 2021-06-20 MED ORDER — LOSARTAN POTASSIUM-HCTZ 100-12.5 MG PO TABS
1.0000 | ORAL_TABLET | Freq: Every day | ORAL | 3 refills | Status: DC
  Filled 2021-06-20: qty 30, 30d supply, fill #0
  Filled 2021-08-05 – 2021-08-29 (×2): qty 30, 30d supply, fill #1
  Filled 2021-11-07: qty 30, 30d supply, fill #2

## 2021-06-20 MED ORDER — VARENICLINE TARTRATE 1 MG PO TABS
1.0000 mg | ORAL_TABLET | Freq: Two times a day (BID) | ORAL | 0 refills | Status: DC
  Filled 2021-06-20: qty 60, 30d supply, fill #0

## 2021-06-20 NOTE — Progress Notes (Signed)
Complete physical exam  Patient: Joshua Henry   DOB: 1965-03-08   56 y.o. Male  MRN: [id number]  Subjective:    Chief Complaint  Patient presents with   Annual Exam    Fasting annual exam. No new concerns.     Joshua Henry is a 56 y.o. male who presents today for a complete physical exam. He reports consuming a  low suga  diet.  Staying active QD  He generally feels well. He reports sleeping poorly.  He states that he would like to start Chantix again and is ready to put more time and energy and effort into quitting smoking.  He continues to do well on Adderall.  He gets 5 or 6 hours with the benefit out of it and sometimes has difficulty knowing when it wears off.  He did injure his left calf area in the recent past and is now still having some difficulty with acute tenderness in 1 particular area.  Continues on his blood pressure medication.  He has noted an increase in his weight.  His drinking habits can be as many as 4-5 beers per day but is do this on a regular basis.  His allergies seem to be under good control.  He does have a history of colonic polyps however did have a recent colonoscopy which was negative.  He is to repeat that again in 10 years.  His work and home life are going well.  Family and social history as well as health maintenance and immunizations was reviewed   Most recent fall risk assessment:    06/09/2016    8:26 AM  [place] in the past year? No     Most recent depression screenings:    04/04/2021   10:37 AM 06/15/2020    9:35 AM  PHQ 2/9 Scores  PHQ - 2 Score 0 0    Vision:Within last year  Patient Active Problem List   Diagnosis Date Noted   History of rotator cuff surgery 06/15/2020   AC (acromioclavicular) arthritis 08/19/2019   Personal history of colonic polyps 06/09/2016   Essential hypertension 03/08/2015   Alcohol use 02/22/2015   Current smoker 02/22/2015   Hyperlipidemia LDL goal <130 02/22/2015   Rhinitis,  allergic 02/22/2015   ADD (attention deficit disorder) 03/13/2011   Past Medical History:  Diagnosis Date   ADHD    Allergy    RHINITIS   H/O: rheumatic fever    Heart murmur    TRIVIAL TRICUSPPID INSUFF.   Hypertension    Smoker    Past Surgical History:  Procedure Laterality Date   COLONOSCOPY     with polyps   INGUINAL HERNIA REPAIR Left 01/21/2019   Procedure: LAPAROSCOPIC LEFT  INGUINAL HERNIA REPAIR WITH MESH;  Surgeon: Ralene Ok, MD;  Location: [facility];  Service: General;  Laterality: Left;   INSERTION OF MESH Left 01/21/2019   Procedure: Insertion Of Mesh;  Surgeon: Ralene Ok, MD;  Location: [facility];  Service: General;  Laterality: Left;   KNEE ARTHROSCOPY Left    torn meniscus   ROTATOR CUFF REPAIR  04/22/2020   injury   TONSILLECTOMY     VASECTOMY  2008   Social History   Tobacco Use   Smoking status: Some Days    Packs/day: 1.00    Years: 30.00    Total pack years: 30.00    Types: Cigarettes, Cigars    Last attempt to quit: 07/2020    Years since  quitting: 0.9   Smokeless tobacco: Never  Vaping Use   Vaping Use: Former   Substances: Nicotine  Substance Use Topics   Alcohol use: Yes    Alcohol/week: 6.0 standard drinks of alcohol    Types: 6 Cans of beer per week   Drug use: Yes    Frequency: 5.0 times per week    Types: Marijuana   Family History  Problem Relation Age of Onset   Cancer Father    Cancer Maternal Grandfather    Colon cancer Maternal Grandfather 53   Colon polyps Neg Hx    Esophageal cancer Neg Hx    Rectal cancer Neg Hx    Stomach cancer Neg Hx    No Known Allergies    Patient Care Team: Denita Lung, MD as PCP - General (Family Medicine)   Outpatient Medications Prior to Visit  Medication Sig   amphetamine-dextroamphetamine (ADDERALL) 20 MG tablet Take 0.5 tablets (10 mg total) by mouth 2 (two) times daily.   doxycycline (VIBRA-TABS) 100 MG tablet Take 1 tablet (100 mg total) by mouth 2 (two) times daily.    losartan-hydrochlorothiazide (HYZAAR) 50-12.5 MG tablet Take 1 tablet by mouth daily.   varenicline (CHANTIX CONTINUING MONTH PAK) 1 MG tablet Take 1 tablet (1 mg total) by mouth 2 (two) times daily. (Patient not taking: Reported on 04/04/2021)   varenicline (CHANTIX) 0.5 MG tablet Take 1 tablet (0.5 mg total) by mouth 2 (two) times daily. (Patient not taking: Reported on 04/04/2021)   Facility-Administered Medications Prior to Visit  Medication Dose Route Frequency Provider   oxyCODONE (Oxy IR/ROXICODONE) immediate release tablet 5 mg  5 mg Oral Q4H PRN Cherylann Ratel, PA-C    Review of Systems  All other systems reviewed and are negative.         Objective:  Alert and in no distress. Tympanic membranes and canals are normal. Pharyngeal area is normal. Neck is supple without adenopathy or thyromegaly. Cardiac exam shows a regular sinus rhythm without murmurs or gallops. Lungs are clear to auscultation.  Exam of the left lateral mid calf area does show an area of induration and point tenderness and slight erythema to that area.  It is not hot    Pulse 70   Temp (!) 97.5 F (36.4 C)   Ht 5' 10"  (1.778 m)   Wt 199 lb 6.4 oz (90.4 kg)   SpO2 97%   BMI 28.61 kg/m  BP Readings from Last 3 Encounters:  04/04/21 136/84  10/05/20 (!) 155/62  06/15/20 (!) 142/86   Wt Readings from Last 3 Encounters:  06/20/21 199 lb 6.4 oz (90.4 kg)  04/04/21 204 lb 6.4 oz (92.7 kg)  10/05/20 180 lb (81.6 kg)      Physical Exam   Last CBC Lab Results  Component Value Date   WBC 10.1 06/15/2020   HGB 17.5 06/15/2020   HCT 48.9 06/15/2020   MCV 92 06/15/2020   MCH 33.1 (H) 06/15/2020   RDW 12.8 06/15/2020   PLT 193 96/28/3662   Last metabolic panel Lab Results  Component Value Date   GLUCOSE 92 06/15/2020   NA 140 06/15/2020   K 4.3 06/15/2020   CL 102 06/15/2020   CO2 19 (L) 06/15/2020   BUN 17 06/15/2020   CREATININE 0.88 06/15/2020   EGFR 102 06/15/2020   CALCIUM 9.3 06/15/2020    PROT 7.2 06/15/2020   ALBUMIN 4.6 06/15/2020   LABGLOB 2.6 06/15/2020   AGRATIO 1.8 06/15/2020  BILITOT 0.5 06/15/2020   ALKPHOS 94 06/15/2020   AST 21 06/15/2020   ALT 23 06/15/2020   ANIONGAP 11 01/17/2019   Last lipids Lab Results  Component Value Date   CHOL 260 (H) 06/15/2020   HDL 68 06/15/2020   LDLCALC 170 (H) 06/15/2020   TRIG 125 06/15/2020   CHOLHDL 3.8 06/15/2020   Last hemoglobin A1c Lab Results  Component Value Date   HGBA1C 5.6 05/31/2018   Last vitamin D No results found for: "25OHVITD2", "25OHVITD3", "VD25OH"      Assessment & Plan:    Routine general medical examination at a health care facility - Plan: CBC with Differential/Platelet, Comprehensive metabolic panel, Lipid panel  Essential hypertension - Plan: losartan-hydrochlorothiazide (HYZAAR) 100-12.5 MG tablet  Allergic rhinitis, unspecified seasonality, unspecified trigger  Attention deficit disorder, unspecified hyperactivity presence  Alcohol use  Current smoker - Plan: varenicline (CHANTIX CONTINUING MONTH PAK) 1 MG tablet  Hyperlipidemia LDL goal <130  Personal history of colonic polyps  Immunization History  Administered Date(s) Administered   Influenza,inj,Quad PF,6+ Mos 09/04/2018   Moderna Sars-Covid-2 Vaccination 05/30/2019, 07/08/2019, 02/27/2020   Tdap 01/03/2007, 05/31/2018    Health Maintenance  Topic Date Due   COVID-19 Vaccine (4 - Moderna series) 04/23/2020   Zoster Vaccines- Shingrix (1 of 2) 09/20/2021 (Originally 07/13/2015)   HIV Screening  06/21/2022 (Originally 07/12/1980)   INFLUENZA VACCINE  08/02/2021   TETANUS/TDAP  05/30/2028   COLONOSCOPY (Pts 45-31yr Insurance coverage will need to be confirmed)  10/06/2030   Hepatitis C Screening  Completed   HPV VACCINES  Aged Out    Discussed health benefits of physical activity, and encouraged him to engage in regular exercise appropriate for his age and condition.  Problem List Items Addressed This Visit        Cardiovascular and Mediastinum   Essential hypertension - Primary     Respiratory   Rhinitis, allergic     Other   ADD (attention deficit disorder) (Chronic)   Alcohol use   Current smoker   Hyperlipidemia LDL goal <130   Personal history of colonic polyps   Other Visit Diagnoses     Routine general medical examination at a health care facility         After long discussion with him concerning his alcohol consumption and cigarettes, we will work on cigarettes first.  He is still tired on the Chantix.  Also increase his blood pressure medication and we will check both of these in 1 month.  Recommend heat to the lateral aspect of his left calf and we can reevaluate that.  It almost feels as if there is a foreign material although the original injury indicates there should have been any foreign material.  He will continue with heat on this.  Explained the fact that his body might expel it on its own.  JGlo Herring

## 2021-06-21 LAB — COMPREHENSIVE METABOLIC PANEL
ALT: 26 IU/L (ref 0–44)
AST: 25 IU/L (ref 0–40)
Albumin/Globulin Ratio: 1.7 (ref 1.2–2.2)
Albumin: 4.4 g/dL (ref 3.8–4.9)
Alkaline Phosphatase: 97 IU/L (ref 44–121)
BUN/Creatinine Ratio: 13 (ref 9–20)
BUN: 11 mg/dL (ref 6–24)
Bilirubin Total: 0.5 mg/dL (ref 0.0–1.2)
CO2: 22 mmol/L (ref 20–29)
Calcium: 9.2 mg/dL (ref 8.7–10.2)
Chloride: 100 mmol/L (ref 96–106)
Creatinine, Ser: 0.88 mg/dL (ref 0.76–1.27)
Globulin, Total: 2.6 g/dL (ref 1.5–4.5)
Glucose: 116 mg/dL — ABNORMAL HIGH (ref 70–99)
Potassium: 4.7 mmol/L (ref 3.5–5.2)
Sodium: 137 mmol/L (ref 134–144)
Total Protein: 7 g/dL (ref 6.0–8.5)
eGFR: 102 mL/min/{1.73_m2} (ref >59)

## 2021-06-21 LAB — LIPID PANEL
Chol/HDL Ratio: 4.4 ratio (ref 0.0–5.0)
Cholesterol, Total: 271 mg/dL — ABNORMAL HIGH (ref 100–199)
HDL: 61 mg/dL (ref >39)
LDL Chol Calc (NIH): 190 mg/dL — ABNORMAL HIGH (ref 0–99)
Triglycerides: 115 mg/dL (ref 0–149)
VLDL Cholesterol Cal: 20 mg/dL (ref 5–40)

## 2021-06-21 LAB — CBC WITH DIFFERENTIAL/PLATELET
Basophils Absolute: 0.1 10*3/uL (ref 0.0–0.2)
Basos: 1 %
EOS (ABSOLUTE): 0.1 10*3/uL (ref 0.0–0.4)
Eos: 1 %
Hematocrit: 51.2 % — ABNORMAL HIGH (ref 37.5–51.0)
Hemoglobin: 18 g/dL — ABNORMAL HIGH (ref 13.0–17.7)
Immature Grans (Abs): 0 10*3/uL (ref 0.0–0.1)
Immature Granulocytes: 0 %
Lymphocytes Absolute: 2.7 10*3/uL (ref 0.7–3.1)
Lymphs: 28 %
MCH: 32.8 pg (ref 26.6–33.0)
MCHC: 35.2 g/dL (ref 31.5–35.7)
MCV: 93 fL (ref 79–97)
Monocytes Absolute: 0.7 10*3/uL (ref 0.1–0.9)
Monocytes: 7 %
Neutrophils Absolute: 6 10*3/uL (ref 1.4–7.0)
Neutrophils: 63 %
Platelets: 204 10*3/uL (ref 150–450)
RBC: 5.48 x10E6/uL (ref 4.14–5.80)
RDW: 12.2 % (ref 11.6–15.4)
WBC: 9.5 10*3/uL (ref 3.4–10.8)

## 2021-06-23 LAB — HGB A1C W/O EAG: Hgb A1c MFr Bld: 5.6 % (ref 4.8–5.6)

## 2021-06-23 LAB — SPECIMEN STATUS REPORT

## 2021-06-27 ENCOUNTER — Other Ambulatory Visit (HOSPITAL_COMMUNITY): Payer: Self-pay

## 2021-07-11 ENCOUNTER — Encounter: Payer: Self-pay | Admitting: Family Medicine

## 2021-07-22 ENCOUNTER — Other Ambulatory Visit (HOSPITAL_COMMUNITY): Payer: Self-pay

## 2021-07-22 ENCOUNTER — Other Ambulatory Visit: Payer: Self-pay | Admitting: Family Medicine

## 2021-07-22 MED ORDER — AMPHETAMINE-DEXTROAMPHETAMINE 20 MG PO TABS
10.0000 mg | ORAL_TABLET | Freq: Two times a day (BID) | ORAL | 0 refills | Status: DC
  Filled 2021-07-22: qty 30, 30d supply, fill #0

## 2021-07-22 NOTE — Telephone Encounter (Signed)
[facility] is requesting to fill pt adderall. Please advise [facility]

## 2021-07-25 ENCOUNTER — Ambulatory Visit: Payer: BC Managed Care – PPO | Admitting: Family Medicine

## 2021-08-03 ENCOUNTER — Ambulatory Visit: Payer: BC Managed Care – PPO | Admitting: Physician Assistant

## 2021-08-05 ENCOUNTER — Other Ambulatory Visit (HOSPITAL_COMMUNITY): Payer: Self-pay

## 2021-08-08 ENCOUNTER — Other Ambulatory Visit (HOSPITAL_COMMUNITY): Payer: Self-pay

## 2021-08-29 ENCOUNTER — Other Ambulatory Visit: Payer: Self-pay | Admitting: Family Medicine

## 2021-08-29 ENCOUNTER — Other Ambulatory Visit (HOSPITAL_COMMUNITY): Payer: Self-pay

## 2021-08-29 MED ORDER — AMPHETAMINE-DEXTROAMPHETAMINE 10 MG PO TABS
10.0000 mg | ORAL_TABLET | Freq: Two times a day (BID) | ORAL | 0 refills | Status: DC
  Filled 2021-08-29: qty 60, 30d supply, fill #0

## 2021-08-29 MED ORDER — AMPHETAMINE-DEXTROAMPHETAMINE 10 MG PO TABS
10.0000 mg | ORAL_TABLET | Freq: Two times a day (BID) | ORAL | 0 refills | Status: DC
  Filled 2021-11-04: qty 60, 30d supply, fill #0

## 2021-08-29 MED ORDER — AMPHETAMINE-DEXTROAMPHETAMINE 10 MG PO TABS
10.0000 mg | ORAL_TABLET | Freq: Every day | ORAL | 0 refills | Status: DC
  Filled 2021-08-29: qty 60, 60d supply, fill #0
  Filled 2021-10-20: qty 30, 30d supply, fill #0

## 2021-08-29 NOTE — Telephone Encounter (Signed)
Is this okay to refill? 

## 2021-08-30 ENCOUNTER — Other Ambulatory Visit (HOSPITAL_COMMUNITY): Payer: Self-pay

## 2021-08-31 ENCOUNTER — Other Ambulatory Visit (HOSPITAL_COMMUNITY): Payer: Self-pay

## 2021-09-07 ENCOUNTER — Encounter: Payer: Self-pay | Admitting: Internal Medicine

## 2021-10-11 ENCOUNTER — Encounter: Payer: Self-pay | Admitting: Internal Medicine

## 2021-10-20 ENCOUNTER — Other Ambulatory Visit (HOSPITAL_COMMUNITY): Payer: Self-pay

## 2021-10-24 ENCOUNTER — Encounter: Payer: Self-pay | Admitting: Internal Medicine

## 2021-10-24 ENCOUNTER — Other Ambulatory Visit (HOSPITAL_COMMUNITY): Payer: Self-pay

## 2021-10-25 ENCOUNTER — Other Ambulatory Visit (HOSPITAL_COMMUNITY): Payer: Self-pay

## 2021-11-04 ENCOUNTER — Other Ambulatory Visit: Payer: Self-pay | Admitting: Family Medicine

## 2021-11-04 ENCOUNTER — Other Ambulatory Visit (HOSPITAL_COMMUNITY): Payer: Self-pay

## 2021-11-04 MED ORDER — AMPHETAMINE-DEXTROAMPHETAMINE 20 MG PO TABS
10.0000 mg | ORAL_TABLET | Freq: Two times a day (BID) | ORAL | 0 refills | Status: DC
  Filled 2021-11-04: qty 30, 30d supply, fill #0

## 2021-11-04 NOTE — Telephone Encounter (Signed)
Pt. Called stating that his Adderall script he picked up on 10/24/21 they only gave him #30 and he used to get #60 because he takes '10mg'$  BID she he is going to run out early. He said he has 5 pills left. I called his pharmacy to see why they only gave him #30 and they told me his ins. Doesn't cover #60 anymore for him and he can only get #30. But they said he was getting 20 mg 1/2 tablet BID #30 because they were out of 10 mg for several months. So they wanted to know if we could just changed his script to '20mg'$  1/2 tablet BID #30 because his ins. Covers that and they have plenty of 20 mg Adderall.

## 2021-11-07 ENCOUNTER — Other Ambulatory Visit: Payer: Self-pay | Admitting: Family Medicine

## 2021-11-07 ENCOUNTER — Other Ambulatory Visit (HOSPITAL_COMMUNITY): Payer: Self-pay

## 2021-11-07 DIAGNOSIS — I10 Essential (primary) hypertension: Secondary | ICD-10-CM

## 2021-11-07 MED ORDER — LOSARTAN POTASSIUM-HCTZ 100-12.5 MG PO TABS
1.0000 | ORAL_TABLET | Freq: Every day | ORAL | 1 refills | Status: DC
  Filled 2021-11-07: qty 30, 30d supply, fill #0
  Filled 2022-01-06: qty 30, 30d supply, fill #1
  Filled 2022-01-06: qty 83, 83d supply, fill #1

## 2021-11-11 ENCOUNTER — Other Ambulatory Visit: Payer: Self-pay

## 2021-11-11 ENCOUNTER — Other Ambulatory Visit (HOSPITAL_COMMUNITY): Payer: Self-pay

## 2021-11-14 ENCOUNTER — Other Ambulatory Visit (HOSPITAL_COMMUNITY): Payer: Self-pay

## 2021-11-15 ENCOUNTER — Other Ambulatory Visit (HOSPITAL_COMMUNITY): Payer: Self-pay

## 2021-11-18 ENCOUNTER — Other Ambulatory Visit (HOSPITAL_COMMUNITY): Payer: Self-pay

## 2022-01-06 ENCOUNTER — Telehealth: Payer: Self-pay | Admitting: Family Medicine

## 2022-01-06 ENCOUNTER — Other Ambulatory Visit: Payer: Self-pay | Admitting: Family Medicine

## 2022-01-06 ENCOUNTER — Other Ambulatory Visit: Payer: Self-pay | Admitting: Medical

## 2022-01-06 ENCOUNTER — Other Ambulatory Visit: Payer: Self-pay

## 2022-01-06 ENCOUNTER — Other Ambulatory Visit (HOSPITAL_COMMUNITY): Payer: Self-pay

## 2022-01-06 DIAGNOSIS — I10 Essential (primary) hypertension: Secondary | ICD-10-CM

## 2022-01-06 MED ORDER — AMPHETAMINE-DEXTROAMPHETAMINE 20 MG PO TABS
10.0000 mg | ORAL_TABLET | Freq: Two times a day (BID) | ORAL | 0 refills | Status: DC
  Filled 2022-01-06: qty 30, 30d supply, fill #0

## 2022-01-06 MED ORDER — LOSARTAN POTASSIUM-HCTZ 100-12.5 MG PO TABS
1.0000 | ORAL_TABLET | Freq: Every day | ORAL | 1 refills | Status: DC
  Filled 2022-01-06: qty 83, 83d supply, fill #0
  Filled 2022-02-10: qty 30, 30d supply, fill #0
  Filled 2022-02-10: qty 90, 90d supply, fill #0
  Filled 2022-02-10: qty 30, 30d supply, fill #0
  Filled 2022-02-10: qty 90, 90d supply, fill #0
  Filled 2022-07-27: qty 30, 30d supply, fill #0
  Filled 2022-08-31: qty 30, 30d supply, fill #1

## 2022-01-06 MED ORDER — LOSARTAN POTASSIUM-HCTZ 100-12.5 MG PO TABS: 1.0000 | ORAL_TABLET | Freq: Every day | ORAL | 1 refills | Status: DC

## 2022-01-06 NOTE — Telephone Encounter (Signed)
Refill request last apt 06/20/21 next apt 07/03/22. 

## 2022-01-06 NOTE — Telephone Encounter (Signed)
Pt needs a refill on Adderall '20mg'$  to [facility].

## 2022-01-06 NOTE — Telephone Encounter (Signed)
Fax from [facility]  100/12.5

## 2022-01-30 DIAGNOSIS — D1801 Hemangioma of skin and subcutaneous tissue: Secondary | ICD-10-CM | POA: Insufficient documentation

## 2022-01-30 DIAGNOSIS — L821 Other seborrheic keratosis: Secondary | ICD-10-CM | POA: Insufficient documentation

## 2022-01-30 DIAGNOSIS — D489 Neoplasm of uncertain behavior, unspecified: Secondary | ICD-10-CM | POA: Insufficient documentation

## 2022-01-30 DIAGNOSIS — L814 Other melanin hyperpigmentation: Secondary | ICD-10-CM | POA: Insufficient documentation

## 2022-02-10 ENCOUNTER — Other Ambulatory Visit (HOSPITAL_COMMUNITY): Payer: Self-pay

## 2022-02-10 ENCOUNTER — Other Ambulatory Visit: Payer: Self-pay | Admitting: Family Medicine

## 2022-02-10 MED ORDER — AMPHETAMINE-DEXTROAMPHETAMINE 20 MG PO TABS
10.0000 mg | ORAL_TABLET | Freq: Two times a day (BID) | ORAL | 0 refills | Status: DC
  Filled 2022-02-10 – 2022-05-04 (×3): qty 30, 30d supply, fill #0

## 2022-02-10 NOTE — Telephone Encounter (Signed)
Refill request last apt 06/20/21 next apt 07/03/22.

## 2022-02-13 ENCOUNTER — Other Ambulatory Visit (HOSPITAL_COMMUNITY): Payer: Self-pay

## 2022-02-19 ENCOUNTER — Telehealth: Payer: Self-pay | Admitting: Family Medicine

## 2022-02-19 NOTE — Telephone Encounter (Signed)
P.A. ADDERALL

## 2022-02-23 NOTE — Telephone Encounter (Signed)
P.A. approved til 02/19/23, sent mychart message

## 2022-02-28 ENCOUNTER — Other Ambulatory Visit (HOSPITAL_COMMUNITY): Payer: Self-pay

## 2022-03-02 ENCOUNTER — Other Ambulatory Visit (HOSPITAL_COMMUNITY): Payer: Self-pay

## 2022-03-03 ENCOUNTER — Other Ambulatory Visit: Payer: Self-pay

## 2022-03-07 ENCOUNTER — Encounter (HOSPITAL_COMMUNITY): Payer: Self-pay

## 2022-03-07 ENCOUNTER — Other Ambulatory Visit (HOSPITAL_COMMUNITY): Payer: Self-pay

## 2022-03-10 ENCOUNTER — Other Ambulatory Visit: Payer: Self-pay

## 2022-05-04 ENCOUNTER — Other Ambulatory Visit (HOSPITAL_COMMUNITY): Payer: Self-pay

## 2022-05-15 ENCOUNTER — Ambulatory Visit (INDEPENDENT_AMBULATORY_CARE_PROVIDER_SITE_OTHER): Payer: BC Managed Care – PPO | Admitting: Medical

## 2022-05-15 ENCOUNTER — Other Ambulatory Visit (HOSPITAL_COMMUNITY): Payer: Self-pay

## 2022-05-15 ENCOUNTER — Encounter: Payer: Self-pay | Admitting: Medical

## 2022-05-15 VITALS — BP 130/80 | HR 95 | Temp 98.0°F | Wt 206.4 lb

## 2022-05-15 DIAGNOSIS — R0600 Dyspnea, unspecified: Secondary | ICD-10-CM

## 2022-05-15 DIAGNOSIS — F17200 Nicotine dependence, unspecified, uncomplicated: Secondary | ICD-10-CM

## 2022-05-15 DIAGNOSIS — R61 Generalized hyperhidrosis: Secondary | ICD-10-CM | POA: Diagnosis not present

## 2022-05-15 DIAGNOSIS — Z136 Encounter for screening for cardiovascular disorders: Secondary | ICD-10-CM

## 2022-05-15 DIAGNOSIS — I8393 Asymptomatic varicose veins of bilateral lower extremities: Secondary | ICD-10-CM | POA: Insufficient documentation

## 2022-05-15 DIAGNOSIS — R053 Chronic cough: Secondary | ICD-10-CM | POA: Diagnosis not present

## 2022-05-15 LAB — CBC WITH DIFFERENTIAL/PLATELET
Basophils Absolute: 0.1 10*3/uL (ref 0.0–0.2)
Basos: 1 %
EOS (ABSOLUTE): 0.1 10*3/uL (ref 0.0–0.4)
Eos: 1 %
Hematocrit: 49.1 % (ref 37.5–51.0)
Hemoglobin: 17.2 g/dL (ref 13.0–17.7)
Immature Grans (Abs): 0 10*3/uL (ref 0.0–0.1)
Immature Granulocytes: 1 %
Lymphocytes Absolute: 2 10*3/uL (ref 0.7–3.1)
Lymphs: 30 %
MCH: 32.5 pg (ref 26.6–33.0)
MCHC: 35 g/dL (ref 31.5–35.7)
MCV: 93 fL (ref 79–97)
Monocytes Absolute: 0.6 10*3/uL (ref 0.1–0.9)
Monocytes: 8 %
Neutrophils Absolute: 4 10*3/uL (ref 1.4–7.0)
Neutrophils: 59 %
Platelets: 183 10*3/uL (ref 150–450)
RBC: 5.29 x10E6/uL (ref 4.14–5.80)
RDW: 12.3 % (ref 11.6–15.4)
WBC: 6.6 10*3/uL (ref 3.4–10.8)

## 2022-05-15 LAB — COMPREHENSIVE METABOLIC PANEL
ALT: 36 IU/L (ref 0–44)
AST: 36 IU/L (ref 0–40)
Albumin/Globulin Ratio: 1.4 (ref 1.2–2.2)
Albumin: 4.2 g/dL (ref 3.8–4.9)
Alkaline Phosphatase: 113 IU/L (ref 44–121)
BUN/Creatinine Ratio: 14 (ref 9–20)
BUN: 12 mg/dL (ref 6–24)
Bilirubin Total: 0.3 mg/dL (ref 0.0–1.2)
CO2: 20 mmol/L (ref 20–29)
Calcium: 9.3 mg/dL (ref 8.7–10.2)
Chloride: 101 mmol/L (ref 96–106)
Creatinine, Ser: 0.87 mg/dL (ref 0.76–1.27)
Globulin, Total: 2.9 g/dL (ref 1.5–4.5)
Glucose: 115 mg/dL — ABNORMAL HIGH (ref 70–99)
Potassium: 4.5 mmol/L (ref 3.5–5.2)
Sodium: 138 mmol/L (ref 134–144)
Total Protein: 7.1 g/dL (ref 6.0–8.5)
eGFR: 101 mL/min/{1.73_m2} (ref >59)

## 2022-05-15 MED ORDER — ALBUTEROL SULFATE HFA 108 (90 BASE) MCG/ACT IN AERS
2.0000 | INHALATION_SPRAY | Freq: Four times a day (QID) | RESPIRATORY_TRACT | 0 refills | Status: AC | PRN
  Filled 2022-05-15: qty 6.7, 25d supply, fill #0

## 2022-05-15 MED ORDER — BREZTRI AEROSPHERE 160-9-4.8 MCG/ACT IN AERO
2.0000 | INHALATION_SPRAY | Freq: Two times a day (BID) | RESPIRATORY_TRACT | 1 refills | Status: DC
  Filled 2022-05-15: qty 10.7, 30d supply, fill #0
  Filled 2023-01-09: qty 10.7, 30d supply, fill #1

## 2022-05-15 NOTE — Patient Instructions (Signed)
We discussed his concerns and possible causes which could include COPD, chronic inflammation, cardiac issues, infection and other.  PFT abnormal today showing moderate airway obstruction.   Begin trial of Breztri Aerosphere 2 puffs twice a day for maintenance.  Rinse your mouth out with water after use  Begin albuterol rescue inhaler prescribed for emergency use.  Use this inhaler 1 to 2 puffs every 4-6 hours if needed for shortness of breath, wheezing or chest tightness  I strongly recommend stopping tobacco  Expect a phone call about the CT chest coronary scan which is $95 cash.  This will help to look at your lungs and your heart  We will call back with lab results    Chronic Obstructive Pulmonary Disease  Chronic obstructive pulmonary disease (COPD) is a long-term (chronic) lung problem. When you have COPD, it is hard for air to get in and out of your lungs. Usually the condition gets worse over time, and your lungs will never return to normal. There are things you can do to keep yourself as healthy as possible. What are the causes? Smoking. This is the most common cause. Certain genes passed from parent to child (inherited). What increases the risk? Being exposed to secondhand smoke from cigarettes, pipes, or cigars. Being exposed to chemicals and other irritants, such as fumes and dust in the work environment. Having chronic lung conditions or infections. What are the signs or symptoms? Shortness of breath, especially during physical activity. A long-term cough with a large amount of thick mucus. Sometimes, the cough may not have any mucus (dry cough). Wheezing. Breathing quickly. Skin that looks gray or blue, especially in the fingers, toes, or lips. Feeling tired (fatigue). Weight loss. Chest tightness. Having infections often. Episodes when breathing symptoms become much worse (exacerbations). At the later stages of this disease, you may have swelling in the ankles, feet,  or legs. How is this treated? Taking medicines. Quitting smoking, if you smoke. Rehabilitation. This includes steps to make your body work better. It may involve a team of specialists. Doing exercises. Making changes to your diet. Using oxygen. Lung surgery. Lung transplant. Comfort measures (palliative care). Follow these instructions at home: Medicines Take over-the-counter and prescription medicines only as told by your doctor. Talk to your doctor before taking any cough or allergy medicines. You may need to avoid medicines that cause your lungs to be dry. Lifestyle If you smoke, stop smoking. Smoking makes the problem worse. Do not smoke or use any products that contain nicotine or tobacco. If you need help quitting, ask your doctor. Avoid being around things that make your breathing worse. This may include smoke, chemicals, and fumes. Stay active, but remember to rest as well. Learn and use tips on how to manage stress and control your breathing. Make sure you get enough sleep. Most adults need at least 7 hours of sleep every night. Eat healthy foods. Eat smaller meals more often. Rest before meals. Controlled breathing Learn and use tips on how to control your breathing as told by your doctor. Try: Breathing in (inhaling) through your nose for 1 second. Then, pucker your lips and breath out (exhale) through your lips for 2 seconds. Putting one hand on your belly (abdomen). Breathe in slowly through your nose for 1 second. Your hand on your belly should move out. Pucker your lips and breathe out slowly through your lips. Your hand on your belly should move in as you breathe out.  Controlled coughing Learn and use controlled  coughing to clear mucus from your lungs. Follow these steps: Lean your head a little forward. Breathe in deeply. Try to hold your breath for 3 seconds. Keep your mouth slightly open while coughing 2 times. Spit any mucus out into a tissue. Rest and do the  steps again 1 or 2 times as needed. General instructions Make sure you get all the shots (vaccines) that your doctor recommends. Ask your doctor about a flu shot and a pneumonia shot. Use oxygen therapy and pulmonary rehabilitation if told by your doctor. If you need home oxygen therapy, ask your doctor if you should buy a tool to measure your oxygen level (oximeter). Make a COPD action plan with your doctor. This helps you to know what to do if you feel worse than usual. Manage any other conditions you have as told by your doctor. Avoid going outside when it is very hot, cold, or humid. Avoid people who have a sickness you can catch (contagious). Keep all follow-up visits. Contact a doctor if: You cough up more mucus than usual. There is a change in the color or thickness of the mucus. It is harder to breathe than usual. Your breathing is faster than usual. You have trouble sleeping. You need to use your medicines more often than usual. You have trouble doing your normal activities such as getting dressed or walking around the house. Get help right away if: You have shortness of breath while resting. You have shortness of breath that stops you from: Being able to talk. Doing normal activities. Your chest hurts for longer than 5 minutes. Your skin color is more blue than usual. Your pulse oximeter shows that you have low oxygen for longer than 5 minutes. You have a fever. You feel too tired to breathe normally. These symptoms may represent a serious problem that is an emergency. Do not wait to see if the symptoms will go away. Get medical help right away. Call your local emergency services (911 in the [place] Do not drive yourself to the hospital. Summary Chronic obstructive pulmonary disease (COPD) is a long-term lung problem. The way your lungs work will never return to normal. Usually the condition gets worse over time. There are things you can do to keep yourself as healthy as  possible. Take over-the-counter and prescription medicines only as told by your doctor. If you smoke, stop. Smoking makes the problem worse. This information is not intended to replace advice given to you by your health care provider. Make sure you discuss any questions you have with your health care provider. Document Revised: 10/28/2019 Document Reviewed: 10/28/2019 Elsevier Patient Education  2023 [employer].

## 2022-05-15 NOTE — Progress Notes (Signed)
Subjective:  Joshua Henry is a 57 y.o. male who presents for Chief Complaint  Patient presents with   sweating alot    Sweating lot like when he isn't doing anything. Will start sweating if he coughs.  Choking episodes as been getting worse over the last couples months     Here for concerns.  His wife wanted him to come in because of symptoms.  He is a long-term smoker, 1 pack/day times at least 30 years.  Continues to smoke.  He tried to quit with Chantix, but had problems with nausea with this in the past  Lately he has been coughing more.  He notes that he has been coughing for a few months now and lately he has been coughing a lot more in recent weeks.  He does get up phlegm.  He feels like he wants to try an inhaler.  He thinks it would help clear some things up.  He also notes that he has difficulty breathing when he sleeps particular if he is flat on his back.  He sleeps on his side mainly.  He notes that lately when he coughs he starts sweating a lot.  This is a new symptom that can  No recent sore throat or fever or feeling ill, just worse cough and sweating.  He denies chest pain or palpitations, no edema.  He walks on the job a lot but no other specific exercise.  He does take the stairs and sometimes he is short of breath with stairs.  No other aggravating or relieving factors.    No other c/o.  Past Medical History:  Diagnosis Date   ADHD    Allergy    RHINITIS   H/O: rheumatic fever    Heart murmur    TRIVIAL TRICUSPPID INSUFF.   Hypertension    Smoker    Current Outpatient Medications on File Prior to Visit  Medication Sig Dispense Refill   amphetamine-dextroamphetamine (ADDERALL) 20 MG tablet Take 1/2 tablet (10 mg total) by mouth 2 (two) times daily. 30 tablet 0   losartan-hydrochlorothiazide (HYZAAR) 100-12.5 MG tablet Take 1 tablet by mouth daily. 90 tablet 1   No current facility-administered medications on file prior to visit.    The following  portions of the patient's history were reviewed and updated as appropriate: allergies, current medications, past family history, past medical history, past social history, past surgical history and problem list.  ROS Otherwise as in subjective above  Objective: BP 130/80   Pulse 95   Temp 98 F (36.7 C)   Wt 206 lb 6.4 oz (93.6 kg)   SpO2 98%   BMI 29.62 kg/m   General appearance: alert, no distress, well developed, well nourished HEENT: normocephalic, sclerae anicteric, conjunctiva pink and moist, flat TMs, nares with some mucoid discharge, +erythema, pharynx normal Oral cavity: MMM, no lesions Neck: supple, no lymphadenopathy, no thyromegaly, no masses, no JVD or bruit Heart: RRR, normal S1, S2, no murmurs Lungs: CTA bilaterally, no wheezes, rhonchi, or rales Pulses: 2+ radial pulses, 2+ pedal pulses, normal cap refill Ext: no edema, + mild varicose veins bilat LE  PFT reviewed, abnormal showing moderate airway obstruction    Assessment: Encounter Diagnoses  Name Primary?   Smoker Yes   PND (paroxysmal nocturnal dyspnea)    Sweating increase    Chronic cough    Screening for heart disease    Varicose veins of both lower extremities, unspecified whether complicated      Plan: We discussed  his concerns and possible causes which could include COPD, chronic inflammation, cardiac issues, infection and other.  PFT abnormal today showing moderate airway obstruction, suggestive of COPD.   This wasn't a full PFT with pre  and post inhaler trial which can be done going forward at pulmonology Begin trial of Breztri Aerosphere 2 puffs twice a day for maintenance.  Rinse your mouth out with water after use  Begin albuterol rescue inhaler prescribed for emergency use.  Use this inhaler 1 to 2 puffs every 4-6 hours if needed for shortness of breath, wheezing or chest tightness  I demonstrated proper use of inhaler and he did first 2 puffs today of Breztri.  I strongly recommend  stopping tobacco  Expect a phone call about the CT chest coronary scan which is $95 cash.  This will help to look at your lungs and your heart  We will call back with lab results   Joshua Henry was seen today for sweating alot.  Diagnoses and all orders for this visit:  Smoker -     Spirometry with Graph -     CT CARDIAC SCORING (SELF PAY ONLY); Future -     Cancel: EKG 12-Lead  PND (paroxysmal nocturnal dyspnea) -     Spirometry with Graph -     Cancel: EKG 12-Lead  Sweating increase -     Comprehensive metabolic panel -     CBC with Differential/Platelet -     Cancel: EKG 12-Lead  Chronic cough -     Spirometry with Graph -     Comprehensive metabolic panel -     CBC with Differential/Platelet  Screening for heart disease -     CT CARDIAC SCORING (SELF PAY ONLY); Future -     Cancel: EKG 12-Lead  Varicose veins of both lower extremities, unspecified whether complicated  Other orders -     Budeson-Glycopyrrol-Formoterol (BREZTRI AEROSPHERE) 160-9-4.8 MCG/ACT AERO; Inhale 2 puffs into the lungs 2 (two) times daily. -     albuterol (VENTOLIN HFA) 108 (90 Base) MCG/ACT inhaler; Inhale 2 puffs into the lungs every 6 (six) hours as needed for wheezing or shortness of breath.    Follow up: pending labs, studies

## 2022-05-16 NOTE — Progress Notes (Signed)
Results sent through MyChart

## 2022-06-05 ENCOUNTER — Ambulatory Visit (HOSPITAL_COMMUNITY)
Admission: RE | Admit: 2022-06-05 | Discharge: 2022-06-05 | Disposition: A | Discharge status: Home / Self Care | Payer: BC Managed Care – PPO | Source: Ambulatory Visit | Attending: Medical | Admitting: Medical

## 2022-06-05 DIAGNOSIS — Z136 Encounter for screening for cardiovascular disorders: Secondary | ICD-10-CM

## 2022-06-05 DIAGNOSIS — F17200 Nicotine dependence, unspecified, uncomplicated: Secondary | ICD-10-CM

## 2022-06-08 ENCOUNTER — Other Ambulatory Visit: Payer: Self-pay | Admitting: Medical

## 2022-06-08 ENCOUNTER — Other Ambulatory Visit (HOSPITAL_COMMUNITY): Payer: Self-pay

## 2022-06-08 MED ORDER — ROSUVASTATIN CALCIUM 10 MG PO TABS
10.0000 mg | ORAL_TABLET | Freq: Every day | ORAL | 0 refills | Status: DC
  Filled 2022-06-08: qty 30, 30d supply, fill #0
  Filled 2022-07-14: qty 30, 30d supply, fill #1
  Filled 2022-08-08: qty 30, 30d supply, fill #2

## 2022-06-08 NOTE — Progress Notes (Signed)
Results sent through MyChart

## 2022-06-09 NOTE — Progress Notes (Signed)
Results sent through MyChart

## 2022-07-03 ENCOUNTER — Encounter: Payer: BC Managed Care – PPO | Admitting: Family Medicine

## 2022-07-12 ENCOUNTER — Encounter: Payer: Self-pay | Admitting: Family Medicine

## 2022-07-12 ENCOUNTER — Ambulatory Visit (INDEPENDENT_AMBULATORY_CARE_PROVIDER_SITE_OTHER): Payer: BC Managed Care – PPO | Admitting: Family Medicine

## 2022-07-12 VITALS — BP 142/92 | HR 84 | Temp 98.2°F | Resp 16 | Wt 201.0 lb

## 2022-07-12 DIAGNOSIS — I700 Atherosclerosis of aorta: Secondary | ICD-10-CM | POA: Diagnosis not present

## 2022-07-12 DIAGNOSIS — I7121 Aneurysm of the ascending aorta, without rupture: Secondary | ICD-10-CM | POA: Diagnosis not present

## 2022-07-12 DIAGNOSIS — Z789 Other specified health status: Secondary | ICD-10-CM | POA: Diagnosis not present

## 2022-07-12 NOTE — Progress Notes (Signed)
   Subjective:    Patient ID: Joshua Henry, male    DOB: Feb 28, 1965, 57 y.o.   MRN: [id number]  HPI He is here to discuss recent CT scan results for coronary calcium.  The calcium score was quite low at approximately 24.  The scan also showed evidence of aortic atherosclerosis as well as an aneurysm.  He was recently started on Crestor.  His wife is here with him.  He states he is in the process of quitting smoking.  She also has concerns about his alcohol consumption.   Review of Systems     Objective:    Physical Exam Alert and in no distress otherwise not examined        Assessment & Plan:  Aneurysm of ascending aorta without rupture (HCC) - Plan: CT ANGIO CHEST AORTA W/CM & OR WO/CM  Aortic atherosclerosis (HCC)  Alcohol use I discussed in detail with him and his wife the diagnosis of aortic atherosclerosis and need for statin drug.  Also then discussed follow-up concerning the aortic aneurysm based on radiology recommendation. We then discussed smoking cessation in terms of the fact that he had difficulty with Chantix and unacceptable side effects from that. Also again discussed alcohol consumption and the recommendation of keeping it down to 1 or 2/day. He is scheduled for complete exam in August.

## 2022-07-14 ENCOUNTER — Other Ambulatory Visit (HOSPITAL_COMMUNITY): Payer: Self-pay

## 2022-07-14 ENCOUNTER — Other Ambulatory Visit: Payer: Self-pay

## 2022-07-14 ENCOUNTER — Other Ambulatory Visit: Payer: Self-pay | Admitting: Family Medicine

## 2022-07-14 MED ORDER — AMPHETAMINE-DEXTROAMPHETAMINE 20 MG PO TABS
10.0000 mg | ORAL_TABLET | Freq: Two times a day (BID) | ORAL | 0 refills | Status: DC
Start: 2022-07-14 — End: 2022-08-31
  Filled 2022-07-14: qty 30, 30d supply, fill #0

## 2022-07-19 ENCOUNTER — Telehealth: Payer: Self-pay

## 2022-07-19 NOTE — Telephone Encounter (Signed)
Pt. Called wanting you to call him during lunch about an unexpected CT scan result. He said if you could please call him today.

## 2022-07-27 ENCOUNTER — Other Ambulatory Visit: Payer: Self-pay

## 2022-08-08 ENCOUNTER — Other Ambulatory Visit (HOSPITAL_COMMUNITY): Payer: Self-pay

## 2022-08-10 ENCOUNTER — Encounter: Payer: BC Managed Care – PPO | Admitting: Family Medicine

## 2022-08-28 ENCOUNTER — Encounter: Payer: BC Managed Care – PPO | Admitting: Family Medicine

## 2022-08-31 ENCOUNTER — Other Ambulatory Visit (HOSPITAL_COMMUNITY): Payer: Self-pay

## 2022-08-31 ENCOUNTER — Other Ambulatory Visit: Payer: Self-pay | Admitting: Family Medicine

## 2022-08-31 MED ORDER — AMPHETAMINE-DEXTROAMPHETAMINE 20 MG PO TABS
10.0000 mg | ORAL_TABLET | Freq: Two times a day (BID) | ORAL | 0 refills | Status: DC
Start: 2022-08-31 — End: 2022-10-08
  Filled 2022-08-31: qty 30, 30d supply, fill #0

## 2022-08-31 NOTE — Telephone Encounter (Signed)
Is this okay to refill? 

## 2022-09-06 ENCOUNTER — Other Ambulatory Visit (HOSPITAL_COMMUNITY): Payer: Self-pay

## 2022-10-10 ENCOUNTER — Encounter: Payer: Self-pay | Admitting: Family Medicine

## 2022-10-10 ENCOUNTER — Other Ambulatory Visit: Payer: Self-pay

## 2022-10-10 ENCOUNTER — Other Ambulatory Visit (HOSPITAL_COMMUNITY): Payer: Self-pay

## 2022-10-10 ENCOUNTER — Ambulatory Visit: Payer: BC Managed Care – PPO | Admitting: Family Medicine

## 2022-10-10 VITALS — BP 138/84 | HR 79 | Ht 70.0 in | Wt 199.2 lb

## 2022-10-10 DIAGNOSIS — F900 Attention-deficit hyperactivity disorder, predominantly inattentive type: Secondary | ICD-10-CM

## 2022-10-10 DIAGNOSIS — J301 Allergic rhinitis due to pollen: Secondary | ICD-10-CM

## 2022-10-10 DIAGNOSIS — I10 Essential (primary) hypertension: Secondary | ICD-10-CM

## 2022-10-10 DIAGNOSIS — R7309 Other abnormal glucose: Secondary | ICD-10-CM | POA: Diagnosis not present

## 2022-10-10 DIAGNOSIS — F17200 Nicotine dependence, unspecified, uncomplicated: Secondary | ICD-10-CM

## 2022-10-10 DIAGNOSIS — E785 Hyperlipidemia, unspecified: Secondary | ICD-10-CM

## 2022-10-10 DIAGNOSIS — Z860100 Personal history of colon polyps, unspecified: Secondary | ICD-10-CM

## 2022-10-10 DIAGNOSIS — Z23 Encounter for immunization: Secondary | ICD-10-CM

## 2022-10-10 DIAGNOSIS — I7121 Aneurysm of the ascending aorta, without rupture: Secondary | ICD-10-CM | POA: Diagnosis not present

## 2022-10-10 DIAGNOSIS — Z0000 Encounter for general adult medical examination without abnormal findings: Secondary | ICD-10-CM | POA: Diagnosis not present

## 2022-10-10 DIAGNOSIS — Z789 Other specified health status: Secondary | ICD-10-CM

## 2022-10-10 DIAGNOSIS — I700 Atherosclerosis of aorta: Secondary | ICD-10-CM

## 2022-10-10 LAB — LIPID PANEL
Chol/HDL Ratio: 2.8 {ratio} (ref 0.0–5.0)
Cholesterol, Total: 201 mg/dL — ABNORMAL HIGH (ref 100–199)
HDL: 71 mg/dL (ref >39)
LDL Chol Calc (NIH): 112 mg/dL — ABNORMAL HIGH (ref 0–99)
Triglycerides: 99 mg/dL (ref 0–149)
VLDL Cholesterol Cal: 18 mg/dL (ref 5–40)

## 2022-10-10 LAB — POCT GLYCOSYLATED HEMOGLOBIN (HGB A1C): Hemoglobin A1C: 5.7 % — AB (ref 4.0–5.6)

## 2022-10-10 MED ORDER — LOSARTAN POTASSIUM-HCTZ 100-12.5 MG PO TABS
1.0000 | ORAL_TABLET | Freq: Every day | ORAL | 1 refills | Status: DC
Start: 2022-10-10 — End: 2023-11-15
  Filled 2022-10-10 (×5): qty 30, 30d supply, fill #0
  Filled 2022-11-21: qty 30, 30d supply, fill #1
  Filled 2023-01-09: qty 30, 30d supply, fill #2

## 2022-10-10 MED ORDER — AMPHETAMINE-DEXTROAMPHETAMINE 20 MG PO TABS
10.0000 mg | ORAL_TABLET | Freq: Two times a day (BID) | ORAL | 0 refills | Status: DC
Start: 2022-10-10 — End: 2022-11-21
  Filled 2022-10-10: qty 30, 30d supply, fill #0

## 2022-10-10 MED ORDER — VARENICLINE TARTRATE (STARTER) 0.5 MG X 11 & 1 MG X 42 PO TBPK
ORAL_TABLET | ORAL | 0 refills | Status: AC
Start: 2022-10-10 — End: ?
  Filled 2022-10-10: qty 53, 28d supply, fill #0

## 2022-10-10 NOTE — Progress Notes (Signed)
Complete physical exam  Patient: Joshua Henry   DOB: 12-02-1965   57 y.o. Male  MRN: [id number]  Subjective:    Chief Complaint  Patient presents with   Annual Exam    Fasting annual exam. Does not want flu or shingles vaccine. No concerns.     Joshua Henry is a 57 y.o. male who presents today for a complete physical exam. He reports consuming a  low carb, no sugar  diet. The patient does not participate in regular exercise at present. He generally feels well. He reports sleeping well.  He would like to start back on Chantix.  He did try it in the past but had difficulty with that and the fact that he was continued to drink.  He has made some dietary changes and cut down on alcohol consumption but is not entirely eliminated it.  He did this to help with his hepatic steatosis.  Also recent scanning did show evidence of aortic atherosclerosis as well as AAA and steatosis.  He also has a previous history of slightly elevated blood sugar and we will need to follow-up on an A1c.  He continues on his blood pressure medication.  He is also scheduled for follow-up colonoscopy.  His work and home life seem to be stable.  He has no other concerns or complaints.  Most recent fall risk assessment:    10/10/2022    8:30 AM  Fall Risk   Falls in the past year? 0  Number falls in past yr: 0  Injury with Fall? 0  Risk for fall due to : No Fall Risks  Follow up Falls evaluation completed     Most recent depression screenings:    10/10/2022    8:30 AM 07/12/2022    9:04 AM  PHQ 2/9 Scores  PHQ - 2 Score 0 0    Vision:Not within last year     Patient Care Team: Ronnald Nian, MD as PCP - General (Family Medicine)   Outpatient Medications Prior to Visit  Medication Sig Note   rosuvastatin (CRESTOR) 10 MG tablet Take 1 tablet (10 mg total) by mouth daily.    albuterol (VENTOLIN HFA) 108 (90 Base) MCG/ACT inhaler Inhale 2 puffs into the lungs every 6 (six) hours as needed for  wheezing or shortness of breath. (Patient not taking: Reported on 10/10/2022) 10/10/2022: As needed   Budeson-Glycopyrrol-Formoterol (BREZTRI AEROSPHERE) 160-9-4.8 MCG/ACT AERO Inhale 2 puffs into the lungs 2 (two) times daily. (Patient not taking: Reported on 10/10/2022)    [DISCONTINUED] amphetamine-dextroamphetamine (ADDERALL) 20 MG tablet Take 1/2 tablet (10 mg total) by mouth 2 (two) times daily.    [DISCONTINUED] losartan-hydrochlorothiazide (HYZAAR) 100-12.5 MG tablet Take 1 tablet by mouth daily.    No facility-administered medications prior to visit.    Review of Systems  All other systems reviewed and are negative.         Objective:     BP 138/84   Pulse 79   Ht 5\' 10"  (1.778 m)   Wt 199 lb 3.2 oz (90.4 kg)   BMI 28.58 kg/m    Physical Exam  Alert and in no distress. Tympanic membranes and canals are normal. Pharyngeal area is normal. Neck is supple without adenopathy or thyromegaly. Cardiac exam shows a regular sinus rhythm without murmurs or gallops. Lungs are clear to auscultation.      Assessment & Plan:    Routine general medical examination at a health care facility  Attention deficit hyperactivity  disorder (ADHD), predominantly inattentive type - Plan: amphetamine-dextroamphetamine (ADDERALL) 20 MG tablet  Alcohol use  Aneurysm of ascending aorta without rupture (HCC) - Plan: US AORTA  Aortic atherosclerosis (HCC)  Current smoker - Plan: Varenicline Tartrate, Starter, (CHANTIX STARTING MONTH PAK) 0.5 MG X 11 & 1 MG X 42 TBPK  Essential hypertension - Plan: losartan-hydrochlorothiazide (HYZAAR) 100-12.5 MG tablet  History of colonic polyps  Hyperlipidemia LDL goal <130 - Plan: Lipid panel  Elevated glucose - Plan: POCT glycosylated hemoglobin (Hb A1C)  Immunization History  Administered Date(s) Administered   Influenza,inj,Quad PF,6+ Mos 09/04/2018   Moderna Sars-Covid-2 Vaccination 05/30/2019, 07/08/2019, 02/27/2020   Tdap 01/03/2007,  05/31/2018    Health Maintenance  Topic Date Due   HIV Screening  Never done   Lung Cancer Screening  Never done   COVID-19 Vaccine (4 - 2023-24 season) 10/26/2022 (Originally 09/03/2022)   Zoster Vaccines- Shingrix (1 of 2) 01/10/2023 (Originally 07/12/1984)   INFLUENZA VACCINE  04/02/2023 (Originally 08/03/2022)   DTaP/Tdap/Td (3 - Td or Tdap) 05/30/2028   Colonoscopy  10/06/2030   Hepatitis C Screening  Completed   HPV VACCINES  Aged Out    Discussed the the alcohol consumption with him and encouraged him to continue to diminish that.  We discussed Chantix and I will place him on that but he is to call me in 1 month to let me know how he is doing on all of this.  Will also check his lipid status since he is now on a statin drug. Problem List Items Addressed This Visit     ADD (attention deficit disorder) (Chronic)   Relevant Medications   amphetamine-dextroamphetamine (ADDERALL) 20 MG tablet   Alcohol use   Aneurysm of ascending aorta without rupture (HCC)   Relevant Medications   losartan-hydrochlorothiazide (HYZAAR) 100-12.5 MG tablet   Other Relevant Orders   US AORTA   Aortic atherosclerosis (HCC)   Relevant Medications   losartan-hydrochlorothiazide (HYZAAR) 100-12.5 MG tablet   Current smoker   Relevant Medications   Varenicline Tartrate, Starter, (CHANTIX STARTING MONTH PAK) 0.5 MG X 11 & 1 MG X 42 TBPK   Essential hypertension   Relevant Medications   losartan-hydrochlorothiazide (HYZAAR) 100-12.5 MG tablet   History of colonic polyps   Hyperlipidemia LDL goal <130   Relevant Medications   losartan-hydrochlorothiazide (HYZAAR) 100-12.5 MG tablet   Other Relevant Orders   Lipid panel   Other Visit Diagnoses     Routine general medical examination at a health care facility    -  Primary   Elevated glucose       Relevant Orders   POCT glycosylated hemoglobin (Hb A1C) (Completed)      No follow-ups on file.     Sharlot Gowda, MD

## 2022-10-11 ENCOUNTER — Other Ambulatory Visit (HOSPITAL_COMMUNITY): Payer: Self-pay

## 2022-10-11 MED ORDER — ROSUVASTATIN CALCIUM 20 MG PO TABS
20.0000 mg | ORAL_TABLET | Freq: Every day | ORAL | 3 refills | Status: DC
Start: 2022-10-11 — End: 2023-11-15
  Filled 2022-10-11: qty 30, 30d supply, fill #0
  Filled 2022-11-21: qty 30, 30d supply, fill #1
  Filled 2023-01-09: qty 30, 30d supply, fill #2

## 2022-10-11 NOTE — Addendum Note (Signed)
Addended by: Ronnald Nian on: 10/11/2022 08:15 AM   Modules accepted: Orders

## 2022-11-21 ENCOUNTER — Other Ambulatory Visit: Payer: Self-pay | Admitting: Family Medicine

## 2022-11-21 ENCOUNTER — Other Ambulatory Visit (HOSPITAL_COMMUNITY): Payer: Self-pay

## 2022-11-21 DIAGNOSIS — F900 Attention-deficit hyperactivity disorder, predominantly inattentive type: Secondary | ICD-10-CM

## 2022-11-21 MED ORDER — AMPHETAMINE-DEXTROAMPHETAMINE 20 MG PO TABS
10.0000 mg | ORAL_TABLET | Freq: Two times a day (BID) | ORAL | 0 refills | Status: DC
  Filled 2022-11-21: qty 30, 30d supply, fill #0

## 2022-11-21 NOTE — Telephone Encounter (Signed)
Is this ok to refill?  

## 2022-11-23 ENCOUNTER — Other Ambulatory Visit (HOSPITAL_COMMUNITY): Payer: Self-pay

## 2022-12-14 ENCOUNTER — Other Ambulatory Visit (HOSPITAL_COMMUNITY): Payer: Self-pay

## 2023-01-09 ENCOUNTER — Other Ambulatory Visit (HOSPITAL_COMMUNITY): Payer: Self-pay

## 2023-01-09 ENCOUNTER — Other Ambulatory Visit: Payer: Self-pay | Admitting: Family Medicine

## 2023-01-09 DIAGNOSIS — F900 Attention-deficit hyperactivity disorder, predominantly inattentive type: Secondary | ICD-10-CM

## 2023-01-09 MED ORDER — AMPHETAMINE-DEXTROAMPHETAMINE 20 MG PO TABS
10.0000 mg | ORAL_TABLET | Freq: Two times a day (BID) | ORAL | 0 refills | Status: DC
  Filled 2023-01-09: qty 30, 30d supply, fill #0

## 2023-01-10 ENCOUNTER — Other Ambulatory Visit: Payer: Self-pay

## 2023-02-06 ENCOUNTER — Other Ambulatory Visit (HOSPITAL_COMMUNITY): Payer: Self-pay

## 2023-02-06 ENCOUNTER — Telehealth: Payer: Self-pay

## 2023-02-06 NOTE — Telephone Encounter (Signed)
Attempted P/A For Amphetamine-Dextroamphetamine 20MG  tablets. However I was informed the Prior Authorization was effective until 02/06/2024

## 2023-02-08 NOTE — Telephone Encounter (Signed)
 error

## 2023-02-27 ENCOUNTER — Encounter: Payer: Self-pay | Admitting: Internal Medicine

## 2023-09-03 ENCOUNTER — Other Ambulatory Visit: Payer: BC Managed Care – PPO

## 2023-09-10 ENCOUNTER — Other Ambulatory Visit

## 2023-09-24 ENCOUNTER — Ambulatory Visit
Admission: RE | Admit: 2023-09-24 | Discharge: 2023-09-24 | Disposition: A | Discharge status: Home / Self Care | Source: Ambulatory Visit | Attending: Family Medicine | Admitting: Family Medicine

## 2023-09-24 DIAGNOSIS — I7121 Aneurysm of the ascending aorta, without rupture: Secondary | ICD-10-CM

## 2023-09-25 ENCOUNTER — Ambulatory Visit: Payer: Self-pay | Admitting: Family Medicine

## 2023-09-25 NOTE — Addendum Note (Signed)
 Addended by: JOYCE NORLEEN BROCKS on: 09/25/2023 08:16 AM   Modules accepted: Orders

## 2023-10-19 ENCOUNTER — Ambulatory Visit
Admission: RE | Admit: 2023-10-19 | Discharge: 2023-10-19 | Disposition: A | Source: Ambulatory Visit | Attending: Family Medicine | Admitting: Family Medicine

## 2023-10-19 DIAGNOSIS — I7121 Aneurysm of the ascending aorta, without rupture: Secondary | ICD-10-CM

## 2023-10-19 MED ORDER — IOPAMIDOL (ISOVUE-370) INJECTION 76%
75.0000 mL | Freq: Once | INTRAVENOUS | Status: AC | PRN
  Administered 2023-10-19: 75 mL via INTRAVENOUS

## 2023-11-02 ENCOUNTER — Telehealth: Payer: Self-pay

## 2023-11-02 NOTE — Telephone Encounter (Signed)
 Copied from CRM #[id number]. Topic: General - Other >> Nov 01, 2023  4:51 PM Selinda RAMAN wrote: Reason for CRM: Recardo the spouse of the patient called in stating she received results for the patient of his CT and U/S results but says she could have swore he also had an MRI. I do not see this in the system but she would like a call to discuss this further.   Called Patient to let him know he did not have an MRI

## 2023-11-15 ENCOUNTER — Ambulatory Visit: Payer: BC Managed Care – PPO | Admitting: Family Medicine

## 2023-11-15 ENCOUNTER — Other Ambulatory Visit (HOSPITAL_COMMUNITY): Payer: Self-pay

## 2023-11-15 ENCOUNTER — Encounter: Payer: Self-pay | Admitting: Family Medicine

## 2023-11-15 VITALS — BP 140/92 | HR 76 | Ht 70.0 in | Wt 210.0 lb

## 2023-11-15 DIAGNOSIS — I700 Atherosclerosis of aorta: Secondary | ICD-10-CM

## 2023-11-15 DIAGNOSIS — Z0000 Encounter for general adult medical examination without abnormal findings: Secondary | ICD-10-CM | POA: Diagnosis not present

## 2023-11-15 DIAGNOSIS — J42 Unspecified chronic bronchitis: Secondary | ICD-10-CM

## 2023-11-15 DIAGNOSIS — R918 Other nonspecific abnormal finding of lung field: Secondary | ICD-10-CM

## 2023-11-15 DIAGNOSIS — E785 Hyperlipidemia, unspecified: Secondary | ICD-10-CM

## 2023-11-15 DIAGNOSIS — F900 Attention-deficit hyperactivity disorder, predominantly inattentive type: Secondary | ICD-10-CM

## 2023-11-15 DIAGNOSIS — Z23 Encounter for immunization: Secondary | ICD-10-CM

## 2023-11-15 DIAGNOSIS — I10 Essential (primary) hypertension: Secondary | ICD-10-CM

## 2023-11-15 DIAGNOSIS — R7309 Other abnormal glucose: Secondary | ICD-10-CM | POA: Diagnosis not present

## 2023-11-15 LAB — POCT GLYCOSYLATED HEMOGLOBIN (HGB A1C): Hemoglobin A1C: 5.5 % (ref 4.0–5.6)

## 2023-11-15 MED ORDER — AMPHETAMINE-DEXTROAMPHETAMINE 20 MG PO TABS
10.0000 mg | ORAL_TABLET | Freq: Two times a day (BID) | ORAL | 0 refills | Status: DC
  Filled 2023-11-15: qty 30, 30d supply, fill #0

## 2023-11-15 MED ORDER — LOSARTAN POTASSIUM-HCTZ 100-12.5 MG PO TABS
1.0000 | ORAL_TABLET | Freq: Every day | ORAL | 1 refills | Status: AC
  Filled 2023-11-15: qty 30, 30d supply, fill #0
  Filled 2023-12-11: qty 30, 30d supply, fill #1

## 2023-11-15 MED ORDER — BREZTRI AEROSPHERE 160-9-4.8 MCG/ACT IN AERO
2.0000 | INHALATION_SPRAY | Freq: Two times a day (BID) | RESPIRATORY_TRACT | 1 refills | Status: AC
  Filled 2023-11-15: qty 10.7, 30d supply, fill #0
  Filled 2023-12-11: qty 10.7, 30d supply, fill #1

## 2023-11-15 MED ORDER — ROSUVASTATIN CALCIUM 20 MG PO TABS
20.0000 mg | ORAL_TABLET | Freq: Every day | ORAL | 3 refills | Status: AC
  Filled 2023-11-15: qty 30, 30d supply, fill #0
  Filled 2023-12-11: qty 30, 30d supply, fill #1

## 2023-11-15 NOTE — Progress Notes (Signed)
 Name: Joshua Henry   Date of Visit: 11/15/23   Date of last visit with me: Visit date not found   CHIEF COMPLAINT:  Chief Complaint  Patient presents with   Annual Exam    Cpe. Fasting. Fell yesterday morning, got dizzy from coughing.         HPI:  Discussed the use of AI scribe software for clinical note transcription with the patient, who gave verbal consent to proceed.  History of Present Illness   Joshua Henry is a 58 year old male with COPD who presents with a fall due to a coughing fit.  He experienced a fall yesterday morning following a severe coughing fit. He felt dizzy and lost vision before hitting the ground. This is the second occurrence, with a similar event happening approximately six years ago.  He has a chronic cough attributed to his long history of smoking since the 1980s. He has been diagnosed with COPD and was previously prescribed inhalers but discontinued his use due to perceived ineffectiveness.  A recent CT scan revealed a nodule in the adrenal gland and some nodules in the lungs. A follow-up CT scan is planned for March.  He has not taken his blood pressure medication since November due to issues with [facility], resulting in elevated blood pressure readings. He is unsure of the exact numbers but is aware that his blood pressure is usually high.  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 He acknowledges that this might affect his sleep quality and libido. He has made some dietary changes but continues to drink alcohol regularly.  He has been borderline diabetic for some time, as noted during his physicals, but has not been diagnosed with diabetes. He has not been on his wife's insurance for years and is unsure why his prescription provider was changed.         OBJECTIVE:       11/15/2023    8:12 AM  Depression screen PHQ 2/9  Decreased Interest 0  Down, Depressed, Hopeless 0   PHQ - 2 Score 0     BP Readings from Last 3 Encounters:  11/15/23 (!) 140/92  10/10/22 138/84  07/12/22 (!) 142/92    BP (!) 140/92   Pulse 76   Ht 5' 10 (1.778 m)   Wt 210 lb (95.3 kg)   SpO2 90%   BMI 30.13 kg/m    Physical Exam          Physical Exam Constitutional:      Appearance: Normal appearance.  Skin:    Comments: Swelling and bruising of the left perioribtal area. No hematoma. Appropriate wound healing   Neurological:     General: No focal deficit present.     Mental Status: He is alert and oriented to person, place, and time. Mental status is at baseline.     ASSESSMENT/PLAN:   Assessment & Plan Routine general medical examination at a health care facility  Essential hypertension  Elevated glucose  Hyperlipidemia LDL goal <130  Flu vaccine need  Need for COVID-19 vaccine  Attention deficit hyperactivity disorder (ADHD), predominantly inattentive type  Aortic atherosclerosis  Lung nodules  Chronic bronchitis, unspecified chronic bronchitis type (HCC)    Assessment and Plan    Adult Wellness Visit Routine wellness visit with discussion on lifestyle modifications, including reducing alcohol and tobacco use, to improve overall health and potentially libido. - Drew blood for routine lab work. -  Scheduled follow-up appointment in seven months. -Comprehensive annual physical exam completed today. Reviewed interval history, current medical issues, medications, allergies, and preventive care needs. Addressed all patient questions and concerns. Discussed lifestyle factors including diet, exercise, sleep, and stress management. Reviewed recommended age-appropriate screenings, labs, and vaccinations. Counseling provided on healthy habits and routine health maintenance. Follow-up as indicated based on findings and results.   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  Recent coughing fit led to dizziness and fall. Inhalers previously prescribed but not used due to perceived ineffectiveness. Discussed the preventative nature of inhalers and potential need for short course of steroids if symptoms worsen. - Restart daily inhaler for COPD management. - Monitor for worsening symptoms such as increased coughing, wheezing, or mucus production. - Will consider short course of steroids if symptoms worsen.  Pulmonary nodules, likely benign/inflammatory CT scan showed pulmonary nodules likely reactive and inflammatory, not malignant. Plan to monitor for changes. - Ordered repeat CT scan in six months to monitor pulmonary nodules.  Essential hypertension Blood pressure elevated, likely due to not taking medication since November. Discussed medication refill issues with pharmacy. - Sent blood pressure medication prescription to [facility].  Abnormal glucose, borderline diabetes Borderline diabetes with dietary modifications made. - Continue dietary modifications. - Monitor alcohol consumption.  Hyperlipidemia - Sent rosuvastatin  prescription to [facility].  Tobacco use disorder Long-term tobacco use since the 80s contributing to COPD and overall health issues. Discussed benefits of smoking cessation. - Encouraged smoking cessation.  Alcohol use, excessive Excessive alcohol consumption, approximately 24 beers per week, impacting sleep quality and overall health. Discussed poor sleep quality due to alcohol and potential use of sleep aids. - Encouraged reduction in alcohol consumption. - Discussed potential use of sleep aids if needed.  Adrenal nodule, likely benign CT scan showed adrenal nodule likely benign based on appearance. - Continue monitoring with repeat CT scan in six months.  Attention-deficit hyperactivity disorder, predominantly inattentive type - Sent Adderall prescription to [facility].          Josedaniel Haye A. Vita MD [facility]

## 2023-11-16 ENCOUNTER — Ambulatory Visit: Payer: Self-pay | Admitting: Family Medicine

## 2023-11-16 LAB — COMPREHENSIVE METABOLIC PANEL WITH GFR
ALT: 29 IU/L (ref 0–44)
AST: 28 IU/L (ref 0–40)
Albumin: 4.1 g/dL (ref 3.8–4.9)
Alkaline Phosphatase: 93 IU/L (ref 47–123)
BUN/Creatinine Ratio: 12 (ref 9–20)
BUN: 9 mg/dL (ref 6–24)
Bilirubin Total: 0.5 mg/dL (ref 0.0–1.2)
CO2: 24 mmol/L (ref 20–29)
Calcium: 8.8 mg/dL (ref 8.7–10.2)
Chloride: 100 mmol/L (ref 96–106)
Creatinine, Ser: 0.74 mg/dL — ABNORMAL LOW (ref 0.76–1.27)
Globulin, Total: 2.7 g/dL (ref 1.5–4.5)
Glucose: 92 mg/dL (ref 70–99)
Potassium: 4 mmol/L (ref 3.5–5.2)
Sodium: 136 mmol/L (ref 134–144)
Total Protein: 6.8 g/dL (ref 6.0–8.5)
eGFR: 105 mL/min/1.73 (ref >59)

## 2023-11-16 LAB — CBC WITH DIFFERENTIAL/PLATELET
Basophils Absolute: 0.1 x10E3/uL (ref 0.0–0.2)
Basos: 1 %
EOS (ABSOLUTE): 0.1 x10E3/uL (ref 0.0–0.4)
Eos: 1 %
Hematocrit: 47.9 % (ref 37.5–51.0)
Hemoglobin: 16.4 g/dL (ref 13.0–17.7)
Immature Grans (Abs): 0 x10E3/uL (ref 0.0–0.1)
Immature Granulocytes: 0 %
Lymphocytes Absolute: 2.5 x10E3/uL (ref 0.7–3.1)
Lymphs: 28 %
MCH: 33.3 pg — ABNORMAL HIGH (ref 26.6–33.0)
MCHC: 34.2 g/dL (ref 31.5–35.7)
MCV: 97 fL (ref 79–97)
Monocytes Absolute: 0.7 x10E3/uL (ref 0.1–0.9)
Monocytes: 8 %
Neutrophils Absolute: 5.6 x10E3/uL (ref 1.4–7.0)
Neutrophils: 62 %
Platelets: 181 x10E3/uL (ref 150–450)
RBC: 4.93 x10E6/uL (ref 4.14–5.80)
RDW: 12.9 % (ref 11.6–15.4)
WBC: 8.9 x10E3/uL (ref 3.4–10.8)

## 2023-11-16 LAB — LIPID PANEL
Chol/HDL Ratio: 3.6 ratio (ref 0.0–5.0)
Cholesterol, Total: 231 mg/dL — ABNORMAL HIGH (ref 100–199)
HDL: 64 mg/dL (ref >39)
LDL Chol Calc (NIH): 139 mg/dL — ABNORMAL HIGH (ref 0–99)
Triglycerides: 158 mg/dL — ABNORMAL HIGH (ref 0–149)
VLDL Cholesterol Cal: 28 mg/dL (ref 5–40)

## 2023-11-21 ENCOUNTER — Other Ambulatory Visit (HOSPITAL_COMMUNITY): Payer: Self-pay

## 2023-11-21 ENCOUNTER — Other Ambulatory Visit: Payer: Self-pay

## 2023-12-07 ENCOUNTER — Encounter (HOSPITAL_COMMUNITY): Payer: Self-pay | Admitting: General Surgery

## 2023-12-11 ENCOUNTER — Other Ambulatory Visit (HOSPITAL_COMMUNITY): Payer: Self-pay

## 2024-01-04 ENCOUNTER — Other Ambulatory Visit: Payer: Self-pay | Admitting: Family Medicine

## 2024-01-04 DIAGNOSIS — Z23 Encounter for immunization: Secondary | ICD-10-CM

## 2024-01-04 DIAGNOSIS — F900 Attention-deficit hyperactivity disorder, predominantly inattentive type: Secondary | ICD-10-CM

## 2024-01-04 NOTE — Telephone Encounter (Signed)
 Copied from CRM [id number]. Topic: Clinical - Medication Refill >> Jan 04, 2024 12:51 PM Hadassah PARAS wrote: Medication: amphetamine -dextroamphetamine  (ADDERALL) 20 MG tablet   Has the patient contacted their pharmacy? No (Agent: If no, request that the patient contact the pharmacy for the refill. If patient does not wish to contact the pharmacy document the reason why and proceed with request.) (Agent: If yes, when and what did the pharmacy advise?)  This is the patient's preferred pharmacy:  [facility] [address], [address] [place] [address] Phone: [phone] Fax: [phone]    Is this the correct pharmacy for this prescription? Yes If no, delete pharmacy and type the correct one.   Has the prescription been filled recently? Yes  Is the patient out of the medication? Yes  Has the patient been seen for an appointment in the last year OR does the patient have an upcoming appointment? Yes  Can we respond through MyChart? No  Agent: Please be advised that Rx refills may take up to 3 business days. We ask that you follow-up with your pharmacy.

## 2024-01-07 NOTE — Telephone Encounter (Signed)
 Last appt:11/15/23

## 2024-01-08 ENCOUNTER — Other Ambulatory Visit (HOSPITAL_COMMUNITY): Payer: Self-pay

## 2024-01-08 MED ORDER — AMPHETAMINE-DEXTROAMPHETAMINE 20 MG PO TABS
10.0000 mg | ORAL_TABLET | Freq: Two times a day (BID) | ORAL | 0 refills | Status: DC
  Filled 2024-01-08: qty 30, 30d supply, fill #0

## 2024-01-18 ENCOUNTER — Other Ambulatory Visit (HOSPITAL_COMMUNITY): Payer: Self-pay

## 2024-02-01 ENCOUNTER — Telehealth: Payer: Self-pay

## 2024-02-01 DIAGNOSIS — Z23 Encounter for immunization: Secondary | ICD-10-CM

## 2024-02-01 DIAGNOSIS — F900 Attention-deficit hyperactivity disorder, predominantly inattentive type: Secondary | ICD-10-CM

## 2024-02-01 NOTE — Telephone Encounter (Signed)
 Last appt. 11/15/23  Copied from CRM #[id number]. Topic: Clinical - Medication Refill >> Feb 01, 2024 12:34 PM Suzen RAMAN wrote: Medication: amphetamine -dextroamphetamine  (ADDERALL) 20 MG tablet   Has the patient contacted their pharmacy? Yes   This is the patient's preferred pharmacy:  [place] [facility] [address] [place] [address] Phone: [phone] Fax: [phone]    Is this the correct pharmacy for this prescription? Yes If no, delete pharmacy and type the correct one.   Has the prescription been filled recently? No  Is the patient out of the medication? Yes  Has the patient been seen for an appointment in the last year OR does the patient have an upcoming appointment? Yes  Can we respond through MyChart? Yes  Agent: Please be advised that Rx refills may take up to 3 business days. We ask that you follow-up with your pharmacy.

## 2024-02-02 ENCOUNTER — Other Ambulatory Visit (HOSPITAL_COMMUNITY): Payer: Self-pay

## 2024-02-02 MED ORDER — AMPHETAMINE-DEXTROAMPHETAMINE 20 MG PO TABS
10.0000 mg | ORAL_TABLET | Freq: Two times a day (BID) | ORAL | 0 refills | Status: AC
  Filled 2024-02-08: qty 30, 30d supply, fill #0

## 2024-02-02 NOTE — Addendum Note (Signed)
 Addended by: JOYCE NORLEEN BROCKS on: 02/02/2024 01:41 PM   Modules accepted: Orders

## 2024-02-08 ENCOUNTER — Other Ambulatory Visit (HOSPITAL_COMMUNITY): Payer: Self-pay
